# Patient Record
Sex: Male | Born: 1995 | Race: White | Hispanic: No | Marital: Single | Smoking: Former smoker
Health system: Southern US, Community
[De-identification: ages and names within clinical notes are randomized; demographics above are authoritative.]

## PROBLEM LIST (undated history)

## (undated) ENCOUNTER — Ambulatory Visit: Admission: EM | Payer: No Typology Code available for payment source

## (undated) DIAGNOSIS — F909 Attention-deficit hyperactivity disorder, unspecified type: Secondary | ICD-10-CM

## (undated) DIAGNOSIS — G4700 Insomnia, unspecified: Secondary | ICD-10-CM

## (undated) HISTORY — DX: Attention-deficit hyperactivity disorder, unspecified type: F90.9

## (undated) HISTORY — PX: WISDOM TOOTH EXTRACTION: SHX21

## (undated) HISTORY — DX: Insomnia, unspecified: G47.00

---
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
## 2014-09-14 HISTORY — PX: FOOT SURGERY: SHX648

## 2017-10-08 ENCOUNTER — Ambulatory Visit (INDEPENDENT_AMBULATORY_CARE_PROVIDER_SITE_OTHER): Payer: PRIVATE HEALTH INSURANCE | Admitting: Family Medicine

## 2017-10-08 ENCOUNTER — Encounter: Payer: Self-pay | Admitting: Family Medicine

## 2017-10-08 VITALS — BP 135/72 | HR 86 | Temp 98.5°F | Resp 16 | Ht 67.0 in | Wt 162.4 lb

## 2017-10-08 DIAGNOSIS — Z7689 Persons encountering health services in other specified circumstances: Secondary | ICD-10-CM

## 2017-10-08 DIAGNOSIS — F909 Attention-deficit hyperactivity disorder, unspecified type: Secondary | ICD-10-CM

## 2017-10-08 DIAGNOSIS — G4700 Insomnia, unspecified: Secondary | ICD-10-CM | POA: Diagnosis not present

## 2017-10-08 DIAGNOSIS — Z23 Encounter for immunization: Secondary | ICD-10-CM | POA: Diagnosis not present

## 2017-10-08 MED ORDER — VARICELLA VIRUS VACCINE LIVE 1350 PFU/0.5ML IJ SUSR
0.5000 mL | Freq: Once | INTRAMUSCULAR | Status: AC
  Administered 2017-10-08: 0.5 mL via SUBCUTANEOUS

## 2017-10-08 MED ORDER — TRAZODONE HCL 50 MG PO TABS: 25.0000 mg | ORAL_TABLET | Freq: Every evening | ORAL | 1 refills | Status: DC | PRN

## 2017-10-08 NOTE — Progress Notes (Signed)
BP 135/72 (BP Location: Left Arm, Patient Position: Sitting)   Pulse 86   Temp 98.5 F (36.9 C) (Oral)   Resp 16   Ht 5\' 7"  (1.702 m)   Wt 162 lb 6.4 oz (73.7 kg)   BMI 25.44 kg/m    Subjective:    Patient ID: Dillon Sosa, male    DOB: 01/15/1996, 22 y.o.   MRN: [id number]  HPI: Dillon Sosa is a 22 y.o. male  Chief Complaint  Patient presents with  . Establish Care   Pt here today to establish care. Has requirements for CNA course including 2 step TB test, Td, and varicella #2.   Only known medical issues is ADHD, for which he hasn't been on medications for several years. Not currently having much trouble with sxs.   Main concern today is 2 year hx of significant insomnia, either has trouble falling asleep or gets falling sensations in the night which wake him up and he can't go back to sleep. Significant family hx of insomnia issues. 4 nights per week having issues with sleep. Nyquil, melatonin, and benadryl haven't helped at all. Does not eat sweets, exercises regularly, does not drink caffiene.   Past Medical History:  Diagnosis Date  . ADHD    Social History   Socioeconomic History  . Marital status: Single    Spouse name: Not on file  . Number of children: Not on file  . Years of education: Not on file  . Highest education level: Not on file  Social Needs  . Financial resource strain: Not on file  . Food insecurity - worry: Not on file  . Food insecurity - inability: Not on file  . Transportation needs - medical: Not on file  . Transportation needs - non-medical: Not on file  Occupational History  . Not on file  Tobacco Use  . Smoking status: Former Smoker    Types: Cigarettes  . Smokeless tobacco: Current User    Types: Chew  Substance and Sexual Activity  . Alcohol use: Yes    Comment: socially  . Drug use: No  . Sexual activity: Not on file  Other Topics Concern  . Not on file  Social History Narrative  . Not on file   Relevant  past medical, surgical, family and social history reviewed and updated as indicated. Interim medical history since our last visit reviewed. Allergies and medications reviewed and updated.  Review of Systems  Constitutional: Positive for fatigue (from chronic insomnia).  HENT: Negative.   Respiratory: Negative.   Cardiovascular: Negative.   Gastrointestinal: Negative.   Musculoskeletal: Negative.   Skin: Negative.   Neurological: Negative.   Psychiatric/Behavioral: Positive for sleep disturbance.   Per HPI unless specifically indicated above     Objective:    BP 135/72 (BP Location: Left Arm, Patient Position: Sitting)   Pulse 86   Temp 98.5 F (36.9 C) (Oral)   Resp 16   Ht 5\' 7"  (1.702 m)   Wt 162 lb 6.4 oz (73.7 kg)   BMI 25.44 kg/m   Wt Readings from Last 3 Encounters:  10/08/17 162 lb 6.4 oz (73.7 kg)    Physical Exam  Constitutional: He is oriented to person, place, and time. He appears well-developed and well-nourished. No distress.  HENT:  Head: Atraumatic.  Eyes: Conjunctivae are normal. Pupils are equal, round, and reactive to light.  Neck: Normal range of motion. Neck supple.  Cardiovascular: Normal rate and normal heart sounds.  Pulmonary/Chest:  Effort normal and breath sounds normal. No respiratory distress.  Musculoskeletal: Normal range of motion.  Neurological: He is alert and oriented to person, place, and time.  Skin: Skin is warm and dry.  Psychiatric: He has a normal mood and affect. His behavior is normal.  Nursing note and vitals reviewed.  No results found for this or any previous visit.    Assessment & Plan:   Problem List Items Addressed This Visit      Other   ADHD    Stable, controlled with good lifestyle modifications.       Insomnia    Long discussion about options, pt agreeable to trying trazodone 25-50 mg nightly prn and monitoring for benefit. Continue good sleep hygiene and lifestyle habits. F/u in about 6 weeks for recheck        Other Visit Diagnoses    Need for varicella vaccine    -  Primary   Relevant Medications   varicella virus vaccine live (VARIVAX) injection 0.5 mL (Completed)   Encounter to establish care       Discussed he would need to go to walk in clinic for PPD testing, and Td from last year found in Care Everywhere so not due for new booster       Follow up plan: Return in about 6 weeks (around 11/19/2017) for Sleep f/u.

## 2017-10-10 ENCOUNTER — Encounter: Payer: Self-pay | Admitting: Family Medicine

## 2017-10-10 DIAGNOSIS — G4700 Insomnia, unspecified: Secondary | ICD-10-CM | POA: Insufficient documentation

## 2017-10-10 DIAGNOSIS — F909 Attention-deficit hyperactivity disorder, unspecified type: Secondary | ICD-10-CM | POA: Insufficient documentation

## 2017-10-10 NOTE — Patient Instructions (Signed)
Follow-up in 6 weeks

## 2017-10-10 NOTE — Assessment & Plan Note (Signed)
Long discussion about options, pt agreeable to trying trazodone 25-50 mg nightly prn and monitoring for benefit. Continue good sleep hygiene and lifestyle habits. F/u in about 6 weeks for recheck

## 2017-10-10 NOTE — Assessment & Plan Note (Signed)
Stable, controlled with good lifestyle modifications.

## 2017-11-19 ENCOUNTER — Encounter: Payer: Self-pay | Admitting: Family Medicine

## 2017-11-19 ENCOUNTER — Ambulatory Visit (INDEPENDENT_AMBULATORY_CARE_PROVIDER_SITE_OTHER): Payer: PRIVATE HEALTH INSURANCE | Admitting: Family Medicine

## 2017-11-19 VITALS — BP 120/63 | HR 68 | Temp 98.7°F | Wt 167.8 lb

## 2017-11-19 DIAGNOSIS — G4700 Insomnia, unspecified: Secondary | ICD-10-CM | POA: Diagnosis not present

## 2017-11-19 DIAGNOSIS — F909 Attention-deficit hyperactivity disorder, unspecified type: Secondary | ICD-10-CM

## 2017-11-19 MED ORDER — TRAZODONE HCL 100 MG PO TABS: 100.0000 mg | ORAL_TABLET | Freq: Every day | ORAL | 3 refills | Status: DC

## 2017-11-19 MED ORDER — ATOMOXETINE HCL 40 MG PO CAPS: 40.0000 mg | ORAL_CAPSULE | Freq: Every day | ORAL | 1 refills | Status: DC

## 2017-11-19 NOTE — Progress Notes (Signed)
BP 120/63 (BP Location: Left Arm, Patient Position: Sitting, Cuff Size: Normal)   Pulse 68   Temp 98.7 F (37.1 C) (Oral)   Wt 167 lb 12.8 oz (76.1 kg)   SpO2 99%   BMI 26.28 kg/m    Subjective:    Patient ID: Dillon Sosa, male    DOB: 06-Oct-1995, 22 y.o.   MRN: [id number]  HPI: Dillon Sosa is a 22 y.o. male  Chief Complaint  Patient presents with  . Sleep Follow-up    Patient states he has to take 1.5-2 tablets of trazadone to get it to work.   Pt here today for sleep f/u after starting on trazodone. Has been taking 1.5-2 tabs daily in order to achieve a good night's sleep. States this dose is going very well for him, does not feel groggy when he wakes or cause any other side effects he's noted.   Took ADHD medications in high school, feels he may need to return to them as his professors and employer are noting some glaring focus issues with him. Does not recall which medicine he was on previously but has always tolerated them well with no side effects.   Past Medical History:  Diagnosis Date  . ADHD    Social History   Socioeconomic History  . Marital status: Single    Spouse name: Not on file  . Number of children: Not on file  . Years of education: Not on file  . Highest education level: Not on file  Social Needs  . Financial resource strain: Not on file  . Food insecurity - worry: Not on file  . Food insecurity - inability: Not on file  . Transportation needs - medical: Not on file  . Transportation needs - non-medical: Not on file  Occupational History  . Not on file  Tobacco Use  . Smoking status: Former Smoker    Types: Cigarettes  . Smokeless tobacco: Former Neurosurgeon    Types: Chew    Quit date: 10/15/2017  Substance and Sexual Activity  . Alcohol use: Yes    Comment: socially/weekends  . Drug use: No  . Sexual activity: Not on file  Other Topics Concern  . Not on file  Social History Narrative  . Not on file   Relevant past medical,  surgical, family and social history reviewed and updated as indicated. Interim medical history since our last visit reviewed. Allergies and medications reviewed and updated.  Review of Systems  Per HPI unless specifically indicated above     Objective:    BP 120/63 (BP Location: Left Arm, Patient Position: Sitting, Cuff Size: Normal)   Pulse 68   Temp 98.7 F (37.1 C) (Oral)   Wt 167 lb 12.8 oz (76.1 kg)   SpO2 99%   BMI 26.28 kg/m   Wt Readings from Last 3 Encounters:  11/19/17 167 lb 12.8 oz (76.1 kg)  10/08/17 162 lb 6.4 oz (73.7 kg)    Physical Exam  Constitutional: He is oriented to person, place, and time. He appears well-developed and well-nourished. No distress.  HENT:  Head: Atraumatic.  Eyes: Conjunctivae are normal. Pupils are equal, round, and reactive to light.  Neck: Normal range of motion. Neck supple.  Cardiovascular: Normal rate and normal heart sounds.  Pulmonary/Chest: Effort normal and breath sounds normal. No respiratory distress.  Musculoskeletal: Normal range of motion.  Neurological: He is alert and oriented to person, place, and time.  Skin: Skin is warm and dry.  Psychiatric: He has a normal mood and affect. His behavior is normal.  Nursing note and vitals reviewed.   No results found for this or any previous visit.    Assessment & Plan:   Problem List Items Addressed This Visit      Other   ADHD    Discussed trying non-stimulant options to help avoid worsening his insomnia sxs. Will start Strattera and monitor closely for benefit. Risks and benefits reviewed with pt      Insomnia - Primary    Good benefit was trazodone at higher doses, will increase strength to 100 mg tab prn for sleep.           Follow up plan: Return in about 3 months (around 02/19/2018) for CPE.

## 2017-11-22 NOTE — Patient Instructions (Signed)
Follow up for CPE 

## 2017-11-22 NOTE — Assessment & Plan Note (Signed)
Discussed trying non-stimulant options to help avoid worsening his insomnia sxs. Will start Strattera and monitor closely for benefit. Risks and benefits reviewed with pt

## 2017-11-22 NOTE — Assessment & Plan Note (Signed)
Good benefit was trazodone at higher doses, will increase strength to 100 mg tab prn for sleep.

## 2017-12-01 ENCOUNTER — Other Ambulatory Visit: Payer: Self-pay | Admitting: Family Medicine

## 2017-12-10 ENCOUNTER — Encounter: Payer: Self-pay | Admitting: Family Medicine

## 2017-12-10 ENCOUNTER — Ambulatory Visit: Payer: PRIVATE HEALTH INSURANCE | Admitting: Family Medicine

## 2017-12-10 VITALS — BP 118/71 | HR 97 | Temp 98.3°F | Wt 161.4 lb

## 2017-12-10 DIAGNOSIS — N489 Disorder of penis, unspecified: Secondary | ICD-10-CM

## 2017-12-10 DIAGNOSIS — F909 Attention-deficit hyperactivity disorder, unspecified type: Secondary | ICD-10-CM | POA: Diagnosis not present

## 2017-12-10 DIAGNOSIS — G4700 Insomnia, unspecified: Secondary | ICD-10-CM | POA: Diagnosis not present

## 2017-12-10 MED ORDER — ATOMOXETINE HCL 80 MG PO CAPS: 80.0000 mg | ORAL_CAPSULE | Freq: Every day | ORAL | 3 refills | Status: DC

## 2017-12-10 NOTE — Progress Notes (Signed)
BP 118/71 (BP Location: Right Arm, Patient Position: Sitting, Cuff Size: Large)   Pulse 97   Temp 98.3 F (36.8 C) (Oral)   Wt 161 lb 6.4 oz (73.2 kg)   SpO2 97%   BMI 25.28 kg/m    Subjective:    Patient ID: Dillon Sosa, male    DOB: February 27, 1996, 22 y.o.   MRN: [id number]  HPI: Dillon Sosa is a 22 y.o. male  Chief Complaint  Patient presents with  . Sores    On penis. x's 2 weeks. Sometimes painful.    Pt here today with a new painful rash on penis x 2+ weeks. No penile discharge, dysuria, fevers, weight loss, adenopathy. Has not been trying anything OTC for relief. No known hx of STIs. Has had same partner x 2 months.   Also wanting to discuss changing strattera dose as he feels some benefit but not as much as he would like. Was previously on stimulant medications for his focus issues. Denies side effects from the medication.   Still sleeping very well on current trazodone dose. Very happy with it, does not make him groggy the next morning.   Past Medical History:  Diagnosis Date  . ADHD    Social History   Socioeconomic History  . Marital status: Single    Spouse name: Not on file  . Number of children: Not on file  . Years of education: Not on file  . Highest education level: Not on file  Occupational History  . Not on file  Social Needs  . Financial resource strain: Not on file  . Food insecurity:    Worry: Not on file    Inability: Not on file  . Transportation needs:    Medical: Not on file    Non-medical: Not on file  Tobacco Use  . Smoking status: Former Smoker    Types: Cigarettes  . Smokeless tobacco: Former NeurosurgeonUser    Types: Chew    Quit date: 10/15/2017  Substance and Sexual Activity  . Alcohol use: Yes    Comment: socially/weekends  . Drug use: No  . Sexual activity: Not on file  Lifestyle  . Physical activity:    Days per week: Not on file    Minutes per session: Not on file  . Stress: Not on file  Relationships  . Social  connections:    Talks on phone: Not on file    Gets together: Not on file    Attends religious service: Not on file    Active member of club or organization: Not on file    Attends meetings of clubs or organizations: Not on file    Relationship status: Not on file  . Intimate partner violence:    Fear of current or ex partner: Not on file    Emotionally abused: Not on file    Physically abused: Not on file    Forced sexual activity: Not on file  Other Topics Concern  . Not on file  Social History Narrative  . Not on file    Relevant past medical, surgical, family and social history reviewed and updated as indicated. Interim medical history since our last visit reviewed. Allergies and medications reviewed and updated.  Review of Systems  Per HPI unless specifically indicated above     Objective:    BP 118/71 (BP Location: Right Arm, Patient Position: Sitting, Cuff Size: Large)   Pulse 97   Temp 98.3 F (36.8 C) (Oral)  Wt 161 lb 6.4 oz (73.2 kg)   SpO2 97%   BMI 25.28 kg/m   Wt Readings from Last 3 Encounters:  12/10/17 161 lb 6.4 oz (73.2 kg)  11/19/17 167 lb 12.8 oz (76.1 kg)  10/08/17 162 lb 6.4 oz (73.7 kg)    Physical Exam  Constitutional: He is oriented to person, place, and time. He appears well-developed and well-nourished. No distress.  HENT:  Head: Atraumatic.  Eyes: Pupils are equal, round, and reactive to light. Conjunctivae are normal.  Neck: Normal range of motion. Neck supple.  Cardiovascular: Normal rate and normal heart sounds.  Pulmonary/Chest: Effort normal and breath sounds normal. No respiratory distress.  Genitourinary:  Genitourinary Comments: Multiple erythematous ulcers across penis, one small ulcer on scrotum. Mildly ttp. No drainage.   Musculoskeletal: Normal range of motion.  Neurological: He is alert and oriented to person, place, and time.  Skin: Skin is warm and dry.  Psychiatric: He has a normal mood and affect. His behavior is  normal.  Nursing note and vitals reviewed.   Results for orders placed or performed in visit on 12/10/17  GC/Chlamydia Probe Amp  Result Value Ref Range   Chlamydia trachomatis, NAA Negative Negative   Neisseria gonorrhoeae by PCR Negative Negative  HSV(herpes simplex vrs) 1+2 ab-IgG  Result Value Ref Range   HSV 1 Glycoprotein G Ab, IgG <0.91 0.00 - 0.90 index   HSV 2 IgG, Type Spec <0.91 0.00 - 0.90 index  RPR  Result Value Ref Range   RPR Ser Ql Non Reactive Non Reactive  HIV antibody  Result Value Ref Range   HIV Screen 4th Generation wRfx Non Reactive Non Reactive      Assessment & Plan:   Problem List Items Addressed This Visit      Other   ADHD    Will increase strattera dose and monitor for benefit. Hoping to keep him off of stimulant medications as he's now sleeping well and feeling less agitated.       Insomnia    Stable and well controlled, continue current regimen       Other Visit Diagnoses    Penile lesion    -  Primary   Will do a full STI panel. Discussed vaseline and avoidance of any sexual contact until resolved. Await results   Relevant Orders   HSV(herpes simplex vrs) 1+2 ab-IgG (Completed)   RPR (Completed)   HIV antibody (Completed)   GC/Chlamydia Probe Amp (Completed)       Follow up plan: Return for as scheduled.

## 2017-12-11 LAB — HSV(HERPES SIMPLEX VRS) I + II AB-IGG
HSV 1 Glycoprotein G Ab, IgG: <0.91 index (ref 0.00–0.90)
HSV 2 IgG, Type Spec: <0.91 index (ref 0.00–0.90)

## 2017-12-11 LAB — RPR: RPR: NONREACTIVE

## 2017-12-11 LAB — HIV ANTIBODY (ROUTINE TESTING W REFLEX): HIV Screen 4th Generation wRfx: NONREACTIVE

## 2017-12-12 LAB — GC/CHLAMYDIA PROBE AMP
CHLAMYDIA, DNA PROBE: NEGATIVE
Neisseria gonorrhoeae by PCR: NEGATIVE

## 2017-12-13 ENCOUNTER — Other Ambulatory Visit: Payer: Self-pay | Admitting: Family Medicine

## 2017-12-13 DIAGNOSIS — N489 Disorder of penis, unspecified: Secondary | ICD-10-CM

## 2017-12-13 NOTE — Assessment & Plan Note (Signed)
Stable and well controlled, continue current regimen 

## 2017-12-13 NOTE — Assessment & Plan Note (Signed)
Will increase strattera dose and monitor for benefit. Hoping to keep him off of stimulant medications as he's now sleeping well and feeling less agitated.

## 2017-12-13 NOTE — Patient Instructions (Signed)
Follow up as scheduled.  

## 2017-12-14 ENCOUNTER — Telehealth: Payer: Self-pay

## 2017-12-14 NOTE — Telephone Encounter (Signed)
See other telephone encounter open

## 2017-12-14 NOTE — Telephone Encounter (Signed)
Copied from CRM [id number]. Topic: Quick Communication - Office Called Patient >> Dec 13, 2017  4:53 PM Stephannie LiSimmons, Janett L, NT wrote: Reason for CRM: Patient says he missed a call from the office please cal him at [phone]

## 2017-12-20 ENCOUNTER — Telehealth: Payer: Self-pay | Admitting: Family Medicine

## 2017-12-20 NOTE — Telephone Encounter (Signed)
Copied from CRM [id number]. Topic: Quick Communication - See Telephone Encounter >> Dec 20, 2017  5:16 PM Raquel SarnaHayes, Teresa G wrote: Since STD came back neg, Pt sores are still there and is getting worse.  Pt doesn't know what else to do. Please call pt back at the times of 9 am - 12 pm  - 2 pm.  These asre the only times he can talk during the day due to work.  Pt is also having issue with his MyChart due to his SS number being entered wrong.  He has already spoked with Nutritional therapistMyChart advisors.  They explained this to him.

## 2017-12-21 ENCOUNTER — Telehealth: Payer: Self-pay | Admitting: Family Medicine

## 2017-12-21 MED ORDER — VALACYCLOVIR HCL 1 G PO TABS: 1000.0000 mg | ORAL_TABLET | Freq: Two times a day (BID) | ORAL | 0 refills | Status: DC

## 2017-12-21 MED ORDER — PREDNISONE 20 MG PO TABS: 40.0000 mg | ORAL_TABLET | Freq: Every day | ORAL | 0 refills | Status: DC

## 2017-12-21 NOTE — Telephone Encounter (Signed)
Pt returned call, please call back [phone]

## 2017-12-21 NOTE — Telephone Encounter (Signed)
Let me know if he wants those medications and I'll get them sent

## 2017-12-21 NOTE — Telephone Encounter (Signed)
We can certainly start some antivirals and prednisone to see if that helps any, but what is the status on his Urology referral?

## 2017-12-21 NOTE — Telephone Encounter (Signed)
[facility]. Please send back to me so I can call patient with information provided.

## 2017-12-21 NOTE — Telephone Encounter (Signed)
I have tried calling multiple times, using different phone and call keeps dropping. Verified it is not our phones that keep dropping the calls.

## 2017-12-21 NOTE — Telephone Encounter (Signed)
I tried calling pharmacy 4 times. The call keeps dropping.

## 2017-12-21 NOTE — Telephone Encounter (Signed)
Spoke with patient.  Signal was breaking up. [facility] and medications.  Patient agreed to medication.

## 2017-12-21 NOTE — Telephone Encounter (Signed)
Copied from CRM [id number]. Topic: Quick Communication - See Telephone Encounter >> Dec 21, 2017  9:55 AM Diana EvesHoyt, Maryann B wrote: CRM for notification. See Telephone encounter for: 12/21/17. Verlon AuLeslie with [facility] calling for quantity clarifacation on the valACYclovir (VALTREX) 1000 MG tablet. Does he need a quantity of 20 for 2x 10 days or a quantity of 10 for 2x 5 days. CB# [phone].

## 2017-12-21 NOTE — Telephone Encounter (Signed)
Fleet Contrasachel, I will call patient. But any idea what I could advise patient?

## 2017-12-21 NOTE — Telephone Encounter (Signed)
Closed in error, pt needs to be called back

## 2017-12-21 NOTE — Addendum Note (Signed)
Addended by: Roosvelt MaserLANE, Alanee Ting E on: 12/21/2017 09:58 AM   Modules accepted: Orders

## 2017-12-21 NOTE — Telephone Encounter (Signed)
Will send in prednisone and valtrex to see if this helps with unspecified penile lesions until his Urology appt. Pt was negative for all STIs when checked a week or two ago and has not tried any new products topically.

## 2017-12-21 NOTE — Telephone Encounter (Signed)
As written, BID x 10 days to get him through until his apptkeri

## 2017-12-21 NOTE — Telephone Encounter (Signed)
Tried calling patient, no answer. [facility]. Medication should be at pharmacy this afternoon and [facility] Appointment 12/28/2017 at 3:45PM.  Fleet Contrasachel, please send in medication for patient.

## 2017-12-24 NOTE — Telephone Encounter (Signed)
Patient came and picked up medication on 12/21/17

## 2017-12-28 ENCOUNTER — Ambulatory Visit: Payer: PRIVATE HEALTH INSURANCE | Admitting: Urology

## 2018-02-10 ENCOUNTER — Encounter: Payer: Self-pay | Admitting: Family Medicine

## 2018-02-17 ENCOUNTER — Ambulatory Visit (INDEPENDENT_AMBULATORY_CARE_PROVIDER_SITE_OTHER): Payer: PRIVATE HEALTH INSURANCE | Admitting: Urology

## 2018-02-17 ENCOUNTER — Encounter: Payer: Self-pay | Admitting: Urology

## 2018-02-17 VITALS — BP 119/68 | HR 48 | Ht 66.0 in | Wt 161.2 lb

## 2018-02-17 DIAGNOSIS — R21 Rash and other nonspecific skin eruption: Secondary | ICD-10-CM | POA: Diagnosis not present

## 2018-02-17 MED ORDER — TRIAMCINOLONE ACETONIDE 0.1 % EX CREA: 1.0000 "application " | TOPICAL_CREAM | Freq: Two times a day (BID) | CUTANEOUS | 0 refills | Status: AC

## 2018-02-17 NOTE — Progress Notes (Signed)
02/17/2018 3:05 PM   Dillon Sosa 16-Nov-1995 [id number]  Referring provider: Particia Nearing, PA-C [address] [place], [place] [address]  Chief Complaint  Patient presents with  . Skin Problem    lesions on penis    HPI: Dillon Sosa is a 22 year old male seen in consultation at the request of Roosvelt Maser for evaluation of a penile rash.  He was seen by his PCP on 12/10/2017 with a 2-week history of an erythematous penile rash.  He complains of mild pruritus and some irritation with intercourse.  He had a negative STD work-up.  His exam was described as an erythematous rash with ulceration.  He was treated with prednisone and Valtrex and did not note improvement.  He has tried over-the-counter antifungals and Neosporin.  He has no bothersome lower urinary tract symptoms.   PMH: Past Medical History:  Diagnosis Date  . ADHD   . Insomnia     Surgical History: Past Surgical History:  Procedure Laterality Date  . FOOT SURGERY Left 2016    Home Medications:  Allergies as of 02/17/2018   No Known Allergies     Medication List        Accurate as of 02/17/18  3:05 PM. Always use your most recent med list.          atomoxetine 80 MG capsule Commonly known as:  STRATTERA Take 1 capsule (80 mg total) by mouth daily.   traZODone 100 MG tablet Commonly known as:  DESYREL Take 1 tablet (100 mg total) by mouth at bedtime.       Allergies: No Known Allergies  Family History: Family History  Problem Relation Age of Onset  . Breast cancer Mother   . Insomnia Father   . Heart attack Maternal Grandmother     Social History:  reports that he has quit smoking. His smoking use included cigarettes. He quit after 2.00 years of use. He quit smokeless tobacco use about 4 months ago. His smokeless tobacco use included chew. He reports that he drinks alcohol. He reports that he does not use drugs.  ROS: UROLOGY Frequent Urination?: No Hard to postpone  urination?: No Burning/pain with urination?: No Get up at night to urinate?: No Leakage of urine?: No Urine stream starts and stops?: No Trouble starting stream?: No Do you have to strain to urinate?: No Blood in urine?: No Urinary tract infection?: No Sexually transmitted disease?: No Injury to kidneys or bladder?: No Painful intercourse?: No Weak stream?: No Erection problems?: No Penile pain?: Yes  Gastrointestinal Nausea?: No Vomiting?: No Indigestion/heartburn?: No Diarrhea?: No Constipation?: No  Constitutional Fever: No Night sweats?: No Weight loss?: No Fatigue?: No  Skin Skin rash/lesions?: No Itching?: No  Eyes Blurred vision?: No Double vision?: No  Ears/Nose/Throat Sore throat?: No Sinus problems?: No  Hematologic/Lymphatic Swollen glands?: No Easy bruising?: No  Cardiovascular Leg swelling?: No Chest pain?: No  Respiratory Cough?: No Shortness of breath?: No  Endocrine Excessive thirst?: No  Musculoskeletal Back pain?: No Joint pain?: No  Neurological Headaches?: No Dizziness?: No  Psychologic Depression?: No Anxiety?: No  Physical Exam: BP 119/68 (BP Location: Left Arm, Patient Position: Sitting, Cuff Size: Normal)   Pulse (!) 48   Ht 5\' 6"  (1.676 m)   Wt 161 lb 3.2 oz (73.1 kg)   BMI 26.02 kg/m   Constitutional:  Alert and oriented, No acute distress. HEENT: [place] AT, moist mucus membranes.  Trachea midline, no masses. Cardiovascular: No clubbing, cyanosis, or edema.  Respiratory: Normal respiratory effort, no increased work of breathing. GI: Abdomen is soft, nontender, nondistended, no abdominal masses GU: No CVA tenderness.  Penis is circumcised.  There is a diffuse erythematous macular penile rash.  On the distal penile shaft there is cracking and peeling of the superficial skin. Lymph: No cervical or inguinal lymphadenopathy. Skin: No rashes, bruises or suspicious lesions. Neurologic: Grossly intact, no focal deficits,  moving all 4 extremities. Psychiatric: Normal mood and affect.   Assessment & Plan:   22 year old male with an erythematous macular penile rash. Will treat with a topical steroid and if no improvement would recommend a dermatology evaluation.   Riki AltesScott C Kamorah Nevils, MD  [facility] [address], [address] [place], [place] [address] [phone]

## 2018-02-18 ENCOUNTER — Encounter: Payer: PRIVATE HEALTH INSURANCE | Admitting: Family Medicine

## 2018-03-07 ENCOUNTER — Other Ambulatory Visit: Payer: Self-pay | Admitting: Family Medicine

## 2018-03-09 ENCOUNTER — Encounter: Payer: Self-pay | Admitting: Family Medicine

## 2018-03-09 NOTE — Telephone Encounter (Signed)
trazodone refill Last Refill:11/19/17 # 30 tab 3 RF Last OV: 12/10/17 PCP: Roosvelt Maserachel Lane PA [facility] [address]

## 2018-03-10 MED ORDER — TRAZODONE HCL 100 MG PO TABS: 100.0000 mg | ORAL_TABLET | Freq: Every day | ORAL | 3 refills | Status: DC

## 2018-03-28 ENCOUNTER — Other Ambulatory Visit: Payer: Self-pay | Admitting: Family Medicine

## 2018-03-28 NOTE — Telephone Encounter (Signed)
Strattera refill Last OV:12/10/17 Last refill:12/10/17 30 cap/3 refill ONG:EXBMPCP:Lane [phone] [place] [place] - [address] [phone] (Phone) [phone] (Fax)

## 2018-04-29 ENCOUNTER — Encounter: Payer: Self-pay | Admitting: Family Medicine

## 2018-04-29 ENCOUNTER — Ambulatory Visit (INDEPENDENT_AMBULATORY_CARE_PROVIDER_SITE_OTHER): Payer: PRIVATE HEALTH INSURANCE | Admitting: Family Medicine

## 2018-04-29 ENCOUNTER — Other Ambulatory Visit: Payer: Self-pay

## 2018-04-29 VITALS — BP 105/59 | HR 51 | Temp 98.6°F | Ht 66.0 in | Wt 166.2 lb

## 2018-04-29 DIAGNOSIS — Z0000 Encounter for general adult medical examination without abnormal findings: Secondary | ICD-10-CM | POA: Diagnosis not present

## 2018-04-29 DIAGNOSIS — Z79899 Other long term (current) drug therapy: Secondary | ICD-10-CM

## 2018-04-29 DIAGNOSIS — F909 Attention-deficit hyperactivity disorder, unspecified type: Secondary | ICD-10-CM

## 2018-04-29 DIAGNOSIS — G4700 Insomnia, unspecified: Secondary | ICD-10-CM | POA: Diagnosis not present

## 2018-04-29 LAB — UA/M W/RFLX CULTURE, ROUTINE
Bilirubin, UA: NEGATIVE
Glucose, UA: NEGATIVE
KETONES UA: NEGATIVE
Leukocytes, UA: NEGATIVE
NITRITE UA: NEGATIVE
PH UA: 6 (ref 5.0–7.5)
Protein, UA: NEGATIVE
RBC UA: NEGATIVE
SPEC GRAV UA: 1.01 (ref 1.005–1.030)
UUROB: 0.2 mg/dL (ref 0.2–1.0)

## 2018-04-29 MED ORDER — TRAZODONE HCL 150 MG PO TABS: 150.0000 mg | ORAL_TABLET | Freq: Every day | ORAL | 3 refills | Status: DC

## 2018-04-29 MED ORDER — AMPHETAMINE-DEXTROAMPHETAMINE 15 MG PO TABS: 15.0000 mg | ORAL_TABLET | Freq: Every day | ORAL | 0 refills | Status: DC

## 2018-04-29 NOTE — Progress Notes (Signed)
BP (!) 105/59   Pulse (!) 51   Temp 98.6 F (37 C) (Oral)   Ht 5\' 6"  (1.676 m)   Wt 166 lb 3.2 oz (75.4 kg)   SpO2 96%   BMI 26.83 kg/m    Subjective:    Patient ID: Dillon Sosa, male    DOB: [birth year], 22 y.o.   MRN: [id number]  HPI: Dillon FredricksonJustin E Zayas is a 22 y.o. male presenting on 04/29/2018 for comprehensive medical examination. Current medical complaints include:see below  Having great benefit with trazodone for his insomnia, but still notes 1-2 nights per week where he struggles to sleep well. Tries to avoid caffeine after breakfast and have a steady sleep routine.   Has been on adderall for most of his young life for combined inattentive/hyperactive ADHD but has been off for several years now. Trying strattera the past 6 months at increasing doses with no relief. 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 Feels like he needs to be back on.   Depression Screen done today and results listed below:  [facility]/2018  Decreased Interest 0  Down, Depressed, Hopeless 0  PHQ - 2 Score 0  Altered sleeping 3  Tired, decreased energy 0  Change in appetite 0  Feeling bad or failure about yourself  0  Trouble concentrating 1  Moving slowly or fidgety/restless 0  Suicidal thoughts 0  PHQ-9 Score 4    The patient does not have a history of falls. I did not complete a risk assessment for falls. A plan of care for falls was not documented.   Past Medical History:  Past Medical History:  Diagnosis Date  . ADHD   . Insomnia     Surgical History:  Past Surgical History:  Procedure Laterality Date  . FOOT SURGERY Left 2016    Medications:  No current outpatient medications on file prior to visit.   No current facility-administered medications on file prior to visit.     Allergies:  No Known Allergies  Social History:  Social History   Socioeconomic  History  . Marital status: Single    Spouse name: Not on file  . Number of children: Not on file  . Years of education: Not on file  . Highest education level: Not on file  Occupational History  . Not on file  Social Needs  . Financial resource strain: Not on file  . Food insecurity:    Worry: Not on file    Inability: Not on file  . Transportation needs:    Medical: Not on file    Non-medical: Not on file  Tobacco Use  . Smoking status: Former Smoker    Years: 2.00    Types: Cigarettes  . Smokeless tobacco: Former NeurosurgeonUser    Types: Chew    Quit date: 10/15/2017  Substance and Sexual Activity  . Alcohol use: Yes    Comment: 6-24 beers per week   . Drug use: No  . Sexual activity: Yes  Lifestyle  . Physical activity:    Days per week: Not on file    Minutes per session: Not on file  . Stress: Not on file  Relationships  . Social connections:    Talks on phone: Not on file    Gets together: Not on file    Attends religious service: Not on file    Active member of club or  organization: Not on file    Attends meetings of clubs or organizations: Not on file    Relationship status: Not on file  . Intimate partner violence:    Fear of current or ex partner: Not on file    Emotionally abused: Not on file    Physically abused: Not on file    Forced sexual activity: Not on file  Other Topics Concern  . Not on file  Social History Narrative  . Not on file   Social History   Tobacco Use  Smoking Status Former Smoker  . Years: 2.00  . Types: Cigarettes  Smokeless Tobacco Former NeurosurgeonUser  . Types: Chew  . Quit date: 10/15/2017   Social History   Substance and Sexual Activity  Alcohol Use Yes   Comment: 6-24 beers per week     Family History:  Family History  Problem Relation Age of Onset  . Breast cancer Mother   . Insomnia Father   . Heart attack Maternal Grandmother     Past medical history, surgical history, medications, allergies, family history and social  history reviewed with patient today and changes made to appropriate areas of the chart.   Review of Systems - General ROS: positive for  - sleep disturbance Psychological ROS: positive for - concentration difficulties Ophthalmic ROS: negative ENT ROS: negative Respiratory ROS: no cough, shortness of breath, or wheezing Cardiovascular ROS: no chest pain or dyspnea on exertion Gastrointestinal ROS: no abdominal pain, change in bowel habits, or black or bloody stools Genito-Urinary ROS: no dysuria, trouble voiding, or hematuria Musculoskeletal ROS: negative Neurological ROS: no TIA or stroke symptoms Dermatological ROS: negative All other ROS negative except what is listed above and in the HPI.      Objective:    BP (!) 105/59   Pulse (!) 51   Temp 98.6 F (37 C) (Oral)   Ht 5\' 6"  (1.676 m)   Wt 166 lb 3.2 oz (75.4 kg)   SpO2 96%   BMI 26.83 kg/m   Wt Readings from Last 3 Encounters:  04/29/18 166 lb 3.2 oz (75.4 kg)  02/17/18 161 lb 3.2 oz (73.1 kg)  12/10/17 161 lb 6.4 oz (73.2 kg)    Physical Exam  Constitutional: He is oriented to person, place, and time. He appears well-developed and well-nourished. No distress.  HENT:  Head: Atraumatic.  Right Ear: External ear normal.  Left Ear: External ear normal.  Nose: Nose normal.  Mouth/Throat: Oropharynx is clear and moist.  Eyes: Pupils are equal, round, and reactive to light. Conjunctivae are normal. No scleral icterus.  Neck: Normal range of motion. Neck supple.  Cardiovascular: Normal rate, regular rhythm, normal heart sounds and intact distal pulses.  No murmur heard. Pulmonary/Chest: Effort normal and breath sounds normal. No respiratory distress.  Abdominal: Soft. Bowel sounds are normal. He exhibits no distension and no mass. There is no tenderness. There is no guarding.  Musculoskeletal: Normal range of motion. He exhibits no edema or tenderness.  Neurological: He is alert and oriented to person, place, and time. He  has normal reflexes.  Skin: Skin is warm and dry. No rash noted.  Psychiatric: He has a normal mood and affect. His behavior is normal.  Nursing note and vitals reviewed.   Results for orders placed or performed in visit on 04/29/18  CBC with Differential/Platelet  Result Value Ref Range   WBC 7.1 3.4 - 10.8 x10E3/uL   RBC 4.61 4.14 - 5.80 x10E6/uL   Hemoglobin 14.0 13.0 -  17.7 g/dL   Hematocrit 16.1 09.6 - 51.0 %   MCV 89 79 - 97 fL   MCH 30.4 26.6 - 33.0 pg   MCHC 34.0 31.5 - 35.7 g/dL   RDW 04.5 40.9 - 81.1 %   Platelets 213 150 - 450 x10E3/uL   Neutrophils 57 Not Estab. %   Lymphs 35 Not Estab. %   Monocytes 8 Not Estab. %   Eos 0 Not Estab. %   Basos 0 Not Estab. %   Neutrophils Absolute 4.0 1.4 - 7.0 x10E3/uL   Lymphocytes Absolute 2.5 0.7 - 3.1 x10E3/uL   Monocytes Absolute 0.6 0.1 - 0.9 x10E3/uL   EOS (ABSOLUTE) 0.0 0.0 - 0.4 x10E3/uL   Basophils Absolute 0.0 0.0 - 0.2 x10E3/uL   Immature Granulocytes 0 Not Estab. %   Immature Grans (Abs) 0.0 0.0 - 0.1 x10E3/uL  Comprehensive metabolic panel  Result Value Ref Range   Glucose 64 (L) 65 - 99 mg/dL   BUN 21 (H) 6 - 20 mg/dL   Creatinine, Ser 9.14 0.76 - 1.27 mg/dL   GFR calc non Af Amer 112 >59 mL/min/1.73   GFR calc Af Amer 129 >59 mL/min/1.73   BUN/Creatinine Ratio 22 (H) 9 - 20   Sodium 140 134 - 144 mmol/L   Potassium 4.1 3.5 - 5.2 mmol/L   Chloride 102 96 - 106 mmol/L   CO2 22 20 - 29 mmol/L   Calcium 9.8 8.7 - 10.2 mg/dL   Total Protein 7.3 6.0 - 8.5 g/dL   Albumin 4.8 3.5 - 5.5 g/dL   Globulin, Total 2.5 1.5 - 4.5 g/dL   Albumin/Globulin Ratio 1.9 1.2 - 2.2   Bilirubin Total 0.2 0.0 - 1.2 mg/dL   Alkaline Phosphatase 69 39 - 117 IU/L   AST 32 0 - 40 IU/L   ALT 21 0 - 44 IU/L  Lipid Panel w/o Chol/HDL Ratio  Result Value Ref Range   Cholesterol, Total 159 100 - 199 mg/dL   Triglycerides 782 0 - 149 mg/dL   HDL 58 >95 mg/dL   VLDL Cholesterol Cal 29 5 - 40 mg/dL   LDL Calculated 72 0 - 99 mg/dL  TSH   Result Value Ref Range   TSH 1.440 0.450 - 4.500 uIU/mL  UA/M w/rflx Culture, Routine  Result Value Ref Range   Specific Gravity, UA 1.010 1.005 - 1.030   pH, UA 6.0 5.0 - 7.5   Color, UA Yellow Yellow   Appearance Ur Clear Clear   Leukocytes, UA Negative Negative   Protein, UA Negative Negative/Trace   Glucose, UA Negative Negative   Ketones, UA Negative Negative   RBC, UA Negative Negative   Bilirubin, UA Negative Negative   Urobilinogen, Ur 0.2 0.2 - 1.0 mg/dL   Nitrite, UA Negative Negative  Urine drugs of abuse scrn w alc, routine (Ref Lab)  Result Value Ref Range   Amphetamines, Urine Negative Cutoff=1000 ng/mL   Barbiturate Quant, Ur Negative Cutoff=300 ng/mL   Benzodiazepine Quant, Ur Negative Cutoff=300 ng/mL   Cannabinoid Quant, Ur Negative Cutoff=50 ng/mL   Cocaine (Metab.) Negative Cutoff=300 ng/mL   Opiate Quant, Ur Negative Cutoff=300 ng/mL   PCP Quant, Ur Negative Cutoff=25 ng/mL   Methadone Screen, Urine Negative Cutoff=300 ng/mL   Propoxyphene Negative Cutoff=300 ng/mL   Ethanol, Urine Negative Cutoff=0.020 %      Assessment & Plan:   Problem List Items Addressed This Visit      Other   ADHD - Primary  No benefit with strattera even with dose increases. Will start back on adderall XR and monitor for improvement. Watch closely for worsened anxiety, insomnia.       Insomnia    Increase trazodone to 150 mg as he's still having difficulty about 1/4 of the nights.       Controlled substance agreement signed   Relevant Orders   Urine drugs of abuse scrn w alc, routine (Ref Lab) (Completed)    Other Visit Diagnoses    Physical exam       Relevant Orders   CBC with Differential/Platelet (Completed)   Comprehensive metabolic panel (Completed)   Lipid Panel w/o Chol/HDL Ratio (Completed)   TSH (Completed)   UA/M w/rflx Culture, Routine (Completed)       Discussed aspirin prophylaxis for myocardial infarction prevention and decision was it was not  indicated  LABORATORY TESTING:  Health maintenance labs ordered today as discussed above.   The natural history of prostate cancer and ongoing controversy regarding screening and potential treatment outcomes of prostate cancer has been discussed with the patient. The meaning of a false positive PSA and a false negative PSA has been discussed. He indicates understanding of the limitations of this screening test and wishes not to proceed with screening PSA testing.   IMMUNIZATIONS:   - Tdap: Tetanus vaccination status reviewed: last tetanus booster within 10 years. - Influenza: Postponed to flu season  PATIENT COUNSELING:    Sexuality: Discussed sexually transmitted diseases, partner selection, use of condoms, avoidance of unintended pregnancy  and contraceptive alternatives.   Advised to avoid cigarette smoking.  I discussed with the patient that most people either abstain from alcohol or drink within safe limits (<=14/week and <=4 drinks/occasion for males, <=7/weeks and <= 3 drinks/occasion for females) and that the risk for alcohol disorders and other health effects rises proportionally with the number of drinks per week and how often a drinker exceeds daily limits.  Discussed cessation/primary prevention of drug use and availability of treatment for abuse.   Diet: Encouraged to adjust caloric intake to maintain  or achieve ideal body weight, to reduce intake of dietary saturated fat and total fat, to limit sodium intake by avoiding high sodium foods and not adding table salt, and to maintain adequate dietary potassium and calcium preferably from fresh fruits, vegetables, and low-fat dairy products.    stressed the importance of regular exercise  Injury prevention: Discussed safety belts, safety helmets, smoke detector, smoking near bedding or upholstery.   Dental health: Discussed importance of regular tooth brushing, flossing, and dental visits.   Follow up plan: NEXT PREVENTATIVE  PHYSICAL DUE IN 1 YEAR. Return in about 4 weeks (around 05/27/2018) for ADHD.

## 2018-04-30 LAB — COMPREHENSIVE METABOLIC PANEL
A/G RATIO: 1.9 (ref 1.2–2.2)
ALT: 21 IU/L (ref 0–44)
AST: 32 IU/L (ref 0–40)
Albumin: 4.8 g/dL (ref 3.5–5.5)
Alkaline Phosphatase: 69 IU/L (ref 39–117)
BUN/Creatinine Ratio: 22 — ABNORMAL HIGH (ref 9–20)
BUN: 21 mg/dL — ABNORMAL HIGH (ref 6–20)
Bilirubin Total: 0.2 mg/dL (ref 0.0–1.2)
CO2: 22 mmol/L (ref 20–29)
CREATININE: 0.96 mg/dL (ref 0.76–1.27)
Calcium: 9.8 mg/dL (ref 8.7–10.2)
Chloride: 102 mmol/L (ref 96–106)
GFR calc Af Amer: 129 mL/min/{1.73_m2} (ref >59)
GFR calc non Af Amer: 112 mL/min/{1.73_m2} (ref >59)
Globulin, Total: 2.5 g/dL (ref 1.5–4.5)
Glucose: 64 mg/dL — ABNORMAL LOW (ref 65–99)
POTASSIUM: 4.1 mmol/L (ref 3.5–5.2)
Sodium: 140 mmol/L (ref 134–144)
TOTAL PROTEIN: 7.3 g/dL (ref 6.0–8.5)

## 2018-04-30 LAB — CBC WITH DIFFERENTIAL/PLATELET
BASOS: 0 %
Basophils Absolute: 0 10*3/uL (ref 0.0–0.2)
EOS (ABSOLUTE): 0 10*3/uL (ref 0.0–0.4)
EOS: 0 %
HEMATOCRIT: 41.2 % (ref 37.5–51.0)
Hemoglobin: 14 g/dL (ref 13.0–17.7)
IMMATURE GRANS (ABS): 0 10*3/uL (ref 0.0–0.1)
IMMATURE GRANULOCYTES: 0 %
LYMPHS: 35 %
Lymphocytes Absolute: 2.5 10*3/uL (ref 0.7–3.1)
MCH: 30.4 pg (ref 26.6–33.0)
MCHC: 34 g/dL (ref 31.5–35.7)
MCV: 89 fL (ref 79–97)
Monocytes Absolute: 0.6 10*3/uL (ref 0.1–0.9)
Monocytes: 8 %
NEUTROS PCT: 57 %
Neutrophils Absolute: 4 10*3/uL (ref 1.4–7.0)
PLATELETS: 213 10*3/uL (ref 150–450)
RBC: 4.61 x10E6/uL (ref 4.14–5.80)
RDW: 12.8 % (ref 12.3–15.4)
WBC: 7.1 10*3/uL (ref 3.4–10.8)

## 2018-04-30 LAB — URINE DRUGS OF ABUSE SCREEN W ALC, ROUTINE (REF LAB)
AMPHETAMINES, URINE: NEGATIVE ng/mL
BARBITURATE QUANT UR: NEGATIVE ng/mL
BENZODIAZEPINE QUANT UR: NEGATIVE ng/mL
Cannabinoid Quant, Ur: NEGATIVE ng/mL
Cocaine (Metab.): NEGATIVE ng/mL
Ethanol, Urine: NEGATIVE %
Methadone Screen, Urine: NEGATIVE ng/mL
OPIATE QUANT UR: NEGATIVE ng/mL
PCP QUANT UR: NEGATIVE ng/mL
PROPOXYPHENE: NEGATIVE ng/mL

## 2018-04-30 LAB — LIPID PANEL W/O CHOL/HDL RATIO
Cholesterol, Total: 159 mg/dL (ref 100–199)
HDL: 58 mg/dL (ref >39)
LDL CALC: 72 mg/dL (ref 0–99)
TRIGLYCERIDES: 144 mg/dL (ref 0–149)
VLDL CHOLESTEROL CAL: 29 mg/dL (ref 5–40)

## 2018-04-30 LAB — TSH: TSH: 1.44 u[IU]/mL (ref 0.450–4.500)

## 2018-05-01 DIAGNOSIS — Z79899 Other long term (current) drug therapy: Secondary | ICD-10-CM | POA: Insufficient documentation

## 2018-05-01 NOTE — Assessment & Plan Note (Signed)
No benefit with strattera even with dose increases. Will start back on adderall XR and monitor for improvement. Watch closely for worsened anxiety, insomnia.

## 2018-05-01 NOTE — Patient Instructions (Signed)
Follow up in 1 month   

## 2018-05-01 NOTE — Assessment & Plan Note (Signed)
Increase trazodone to 150 mg as he's still having difficulty about 1/4 of the nights.

## 2018-05-02 ENCOUNTER — Encounter: Payer: Self-pay | Admitting: Family Medicine

## 2018-05-03 ENCOUNTER — Encounter: Payer: Self-pay | Admitting: Family Medicine

## 2018-05-04 ENCOUNTER — Other Ambulatory Visit: Payer: Self-pay | Admitting: Family Medicine

## 2018-05-04 MED ORDER — FLUCONAZOLE 150 MG PO TABS: 150.0000 mg | ORAL_TABLET | Freq: Once | ORAL | 0 refills | Status: AC

## 2018-05-04 MED ORDER — TRIAMCINOLONE ACETONIDE 0.025 % EX OINT: 1.0000 "application " | TOPICAL_OINTMENT | Freq: Two times a day (BID) | CUTANEOUS | 0 refills | Status: DC

## 2018-05-24 ENCOUNTER — Other Ambulatory Visit: Payer: Self-pay | Admitting: Family Medicine

## 2018-06-03 ENCOUNTER — Ambulatory Visit: Payer: PRIVATE HEALTH INSURANCE | Admitting: Family Medicine

## 2018-06-03 ENCOUNTER — Encounter: Payer: Self-pay | Admitting: Family Medicine

## 2018-06-03 ENCOUNTER — Other Ambulatory Visit: Payer: Self-pay

## 2018-06-03 VITALS — BP 129/65 | HR 47 | Temp 98.5°F | Ht 66.0 in | Wt 162.5 lb

## 2018-06-03 DIAGNOSIS — G4700 Insomnia, unspecified: Secondary | ICD-10-CM | POA: Diagnosis not present

## 2018-06-03 DIAGNOSIS — F909 Attention-deficit hyperactivity disorder, unspecified type: Secondary | ICD-10-CM

## 2018-06-03 MED ORDER — AMPHETAMINE-DEXTROAMPHET ER 20 MG PO CP24: 20.0000 mg | ORAL_CAPSULE | ORAL | 0 refills | Status: DC

## 2018-06-03 NOTE — Assessment & Plan Note (Signed)
Will increase to 20 mg adderall XR and monitor for improvement. Notes significant benefit with his focus and ability to complete his work.

## 2018-06-03 NOTE — Progress Notes (Signed)
BP 129/65   Pulse (!) 47   Temp 98.5 F (36.9 C) (Oral)   Ht 5\' 6"  (1.676 m)   Wt 162 lb 8 oz (73.7 kg)   SpO2 99%   BMI 26.23 kg/m    Subjective:    Patient ID: Dillon Sosa, male    DOB: 1995/11/28, 22 y.o.   MRN: [id number]  HPI: Dillon Sosa is a 22 y.o. male  Chief Complaint  Patient presents with  . ADHD    f/u   Here today for ADHD and insomnia f/u.   Significant sleep improvement with dose increase on the trazodone. Not groggy in the mornings, resting well consistently. No side effects reported.   Feels the adderall 15 mg is helping but could be a bit stronger. 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 Denies CP, palpitations, appetite issues.   Relevant past medical, surgical, family and social history reviewed and updated as indicated. Interim medical history since our last visit reviewed. Allergies and medications reviewed and updated.  Review of Systems  Per HPI unless specifically indicated above     Objective:    BP 129/65   Pulse (!) 47   Temp 98.5 F (36.9 C) (Oral)   Ht 5\' 6"  (1.676 m)   Wt 162 lb 8 oz (73.7 kg)   SpO2 99%   BMI 26.23 kg/m   Wt Readings from Last 3 Encounters:  06/03/18 162 lb 8 oz (73.7 kg)  04/29/18 166 lb 3.2 oz (75.4 kg)  02/17/18 161 lb 3.2 oz (73.1 kg)    Physical Exam  Constitutional: He is oriented to person, place, and time. He appears well-developed and well-nourished. No distress.  HENT:  Head: Atraumatic.  Eyes: Conjunctivae and EOM are normal.  Neck: Normal range of motion. Neck supple.  Cardiovascular: Normal rate and regular rhythm.  Pulmonary/Chest: Effort normal and breath sounds normal.  Musculoskeletal: Normal range of motion.  Neurological: He is alert and oriented to person, place, and time.  Skin: Skin is warm and dry.  Psychiatric: He has a normal mood and affect. His behavior is  normal. Thought content normal.  Nursing note and vitals reviewed.   Results for orders placed or performed in visit on 04/29/18  CBC with Differential/Platelet  Result Value Ref Range   WBC 7.1 3.4 - 10.8 x10E3/uL   RBC 4.61 4.14 - 5.80 x10E6/uL   Hemoglobin 14.0 13.0 - 17.7 g/dL   Hematocrit 40.941.2 81.137.5 - 51.0 %   MCV 89 79 - 97 fL   MCH 30.4 26.6 - 33.0 pg   MCHC 34.0 31.5 - 35.7 g/dL   RDW 91.412.8 78.212.3 - 95.615.4 %   Platelets 213 150 - 450 x10E3/uL   Neutrophils 57 Not Estab. %   Lymphs 35 Not Estab. %   Monocytes 8 Not Estab. %   Eos 0 Not Estab. %   Basos 0 Not Estab. %   Neutrophils Absolute 4.0 1.4 - 7.0 x10E3/uL   Lymphocytes Absolute 2.5 0.7 - 3.1 x10E3/uL   Monocytes Absolute 0.6 0.1 - 0.9 x10E3/uL   EOS (ABSOLUTE) 0.0 0.0 - 0.4 x10E3/uL   Basophils Absolute 0.0 0.0 - 0.2 x10E3/uL   Immature Granulocytes 0 Not Estab. %   Immature Grans (Abs) 0.0 0.0 - 0.1 x10E3/uL  Comprehensive metabolic panel  Result Value Ref Range   Glucose 64 (L) 65 - 99 mg/dL  BUN 21 (H) 6 - 20 mg/dL   Creatinine, Ser 1.61 0.76 - 1.27 mg/dL   GFR calc non Af Amer 112 >59 mL/min/1.73   GFR calc Af Amer 129 >59 mL/min/1.73   BUN/Creatinine Ratio 22 (H) 9 - 20   Sodium 140 134 - 144 mmol/L   Potassium 4.1 3.5 - 5.2 mmol/L   Chloride 102 96 - 106 mmol/L   CO2 22 20 - 29 mmol/L   Calcium 9.8 8.7 - 10.2 mg/dL   Total Protein 7.3 6.0 - 8.5 g/dL   Albumin 4.8 3.5 - 5.5 g/dL   Globulin, Total 2.5 1.5 - 4.5 g/dL   Albumin/Globulin Ratio 1.9 1.2 - 2.2   Bilirubin Total 0.2 0.0 - 1.2 mg/dL   Alkaline Phosphatase 69 39 - 117 IU/L   AST 32 0 - 40 IU/L   ALT 21 0 - 44 IU/L  Lipid Panel w/o Chol/HDL Ratio  Result Value Ref Range   Cholesterol, Total 159 100 - 199 mg/dL   Triglycerides 096 0 - 149 mg/dL   HDL 58 >04 mg/dL   VLDL Cholesterol Cal 29 5 - 40 mg/dL   LDL Calculated 72 0 - 99 mg/dL  TSH  Result Value Ref Range   TSH 1.440 0.450 - 4.500 uIU/mL  UA/M w/rflx Culture, Routine  Result Value  Ref Range   Specific Gravity, UA 1.010 1.005 - 1.030   pH, UA 6.0 5.0 - 7.5   Color, UA Yellow Yellow   Appearance Ur Clear Clear   Leukocytes, UA Negative Negative   Protein, UA Negative Negative/Trace   Glucose, UA Negative Negative   Ketones, UA Negative Negative   RBC, UA Negative Negative   Bilirubin, UA Negative Negative   Urobilinogen, Ur 0.2 0.2 - 1.0 mg/dL   Nitrite, UA Negative Negative  Urine drugs of abuse scrn w alc, routine (Ref Lab)  Result Value Ref Range   Amphetamines, Urine Negative Cutoff=1000 ng/mL   Barbiturate Quant, Ur Negative Cutoff=300 ng/mL   Benzodiazepine Quant, Ur Negative Cutoff=300 ng/mL   Cannabinoid Quant, Ur Negative Cutoff=50 ng/mL   Cocaine (Metab.) Negative Cutoff=300 ng/mL   Opiate Quant, Ur Negative Cutoff=300 ng/mL   PCP Quant, Ur Negative Cutoff=25 ng/mL   Methadone Screen, Urine Negative Cutoff=300 ng/mL   Propoxyphene Negative Cutoff=300 ng/mL   Ethanol, Urine Negative Cutoff=0.020 %      Assessment & Plan:   Problem List Items Addressed This Visit      Other   ADHD - Primary    Will increase to 20 mg adderall XR and monitor for improvement. Notes significant benefit with his focus and ability to complete his work.       Insomnia    Improved on 150 mg trazodone. Continue current regimen          Follow up plan: Return in about 3 months (around 09/02/2018) for ADHD.

## 2018-06-03 NOTE — Patient Instructions (Signed)
Follow up in 3 months

## 2018-06-03 NOTE — Assessment & Plan Note (Signed)
Improved on 150 mg trazodone. Continue current regimen

## 2018-06-06 ENCOUNTER — Ambulatory Visit: Payer: Self-pay | Admitting: Family Medicine

## 2018-07-04 ENCOUNTER — Other Ambulatory Visit: Payer: Self-pay | Admitting: Family Medicine

## 2018-07-05 ENCOUNTER — Encounter: Payer: Self-pay | Admitting: Family Medicine

## 2018-07-05 NOTE — Telephone Encounter (Signed)
Spoke with pharmacy. They do have the script. Will refuse this one.

## 2018-07-05 NOTE — Telephone Encounter (Signed)
He should have had a refill available 10/20 - can you check with the pharmacy? Happy to re-send if they didn't get it

## 2018-08-12 ENCOUNTER — Encounter: Payer: Self-pay | Admitting: Family Medicine

## 2018-08-15 ENCOUNTER — Other Ambulatory Visit: Payer: Self-pay | Admitting: Family Medicine

## 2018-08-15 MED ORDER — QUETIAPINE FUMARATE 50 MG PO TABS: ORAL_TABLET | ORAL | 0 refills | Status: DC

## 2018-08-17 ENCOUNTER — Encounter: Payer: Self-pay | Admitting: Family Medicine

## 2018-08-18 ENCOUNTER — Other Ambulatory Visit: Payer: Self-pay | Admitting: Family Medicine

## 2018-08-18 MED ORDER — TRAZODONE HCL 150 MG PO TABS: 150.0000 mg | ORAL_TABLET | Freq: Every day | ORAL | 0 refills | Status: DC

## 2018-08-18 MED ORDER — TRAZODONE HCL 50 MG PO TABS: 25.0000 mg | ORAL_TABLET | Freq: Every evening | ORAL | 0 refills | Status: DC | PRN

## 2018-09-02 ENCOUNTER — Telehealth: Payer: Self-pay

## 2018-09-02 ENCOUNTER — Ambulatory Visit: Payer: PRIVATE HEALTH INSURANCE | Admitting: Family Medicine

## 2018-09-02 ENCOUNTER — Encounter: Payer: Self-pay | Admitting: Family Medicine

## 2018-09-02 VITALS — BP 126/73 | HR 79 | Temp 98.7°F | Ht 66.0 in | Wt 166.0 lb

## 2018-09-02 DIAGNOSIS — G4700 Insomnia, unspecified: Secondary | ICD-10-CM

## 2018-09-02 DIAGNOSIS — F909 Attention-deficit hyperactivity disorder, unspecified type: Secondary | ICD-10-CM | POA: Diagnosis not present

## 2018-09-02 MED ORDER — AMPHETAMINE-DEXTROAMPHET ER 20 MG PO CP24: 20.0000 mg | ORAL_CAPSULE | ORAL | 0 refills | Status: DC

## 2018-09-02 MED ORDER — SUVOREXANT 5 MG PO TABS: 5.0000 mg | ORAL_TABLET | Freq: Every evening | ORAL | 0 refills | Status: DC | PRN

## 2018-09-02 NOTE — Assessment & Plan Note (Signed)
Stable, continue adderall 20 mg XR

## 2018-09-02 NOTE — Telephone Encounter (Signed)
Copied from CRM [phone]. Topic: General - Other >> Sep 02, 2018  2:42 PM Tamela OddiHarris, Brenda J wrote: Reason for CRM: Patient called to get a PA from the doctor for his medication, Suvorexant (BELSOMRA) 5 MG TABS.  Patient stated that the pharmacy said he would need the PA before the prescription could be filled.  Please advise and call patient when completed.  CB# [phone]   PA submitted via Cover My Meds. Will await determination.

## 2018-09-02 NOTE — Progress Notes (Signed)
BP 126/73   Pulse 79   Temp 98.7 F (37.1 C) (Oral)   Ht 5\' 6"  (1.676 m)   Wt 166 lb (75.3 kg)   SpO2 98%   BMI 26.79 kg/m    Subjective:    Patient ID: Dillon Sosa, male    DOB: Aug 02, 1996, 22 y.o.   MRN: [id number]  HPI: Dillon Sosa is a 22 y.o. male  Chief Complaint  Patient presents with  . ADHD   Here today for ADHD and insomnia follow up.   Feeling well on current adderall dose of 20 mg XR. Focus significantly improved as is his performance at school. Lasting through the day from school to work well.   Still struggling with sleep despite trial of seroquel which he tolerated very poorly and increasing trazodone to 200 mg. Typically can fall asleep but does not stay asleep. OTC medications not helpful for him. This has been ongoing for years.   Past Medical History:  Diagnosis Date  . ADHD   . Insomnia    Social History   Socioeconomic History  . Marital status: Single    Spouse name: Not on file  . Number of children: Not on file  . Years of education: Not on file  . Highest education level: Not on file  Occupational History  . Not on file  Social Needs  . Financial resource strain: Not on file  . Food insecurity:    Worry: Not on file    Inability: Not on file  . Transportation needs:    Medical: Not on file    Non-medical: Not on file  Tobacco Use  . Smoking status: Former Smoker    Years: 2.00    Types: Cigarettes  . Smokeless tobacco: Former Neurosurgeon    Types: Chew    Quit date: 10/15/2017  Substance and Sexual Activity  . Alcohol use: Not Currently  . Drug use: No  . Sexual activity: Yes  Lifestyle  . Physical activity:    Days per week: Not on file    Minutes per session: Not on file  . Stress: Not on file  Relationships  . Social connections:    Talks on phone: Not on file    Gets together: Not on file    Attends religious service: Not on file    Active member of club or organization: Not on file    Attends meetings of  clubs or organizations: Not on file    Relationship status: Not on file  . Intimate partner violence:    Fear of current or ex partner: Not on file    Emotionally abused: Not on file    Physically abused: Not on file    Forced sexual activity: Not on file  Other Topics Concern  . Not on file  Social History Narrative  . Not on file    Relevant past medical, surgical, family and social history reviewed and updated as indicated. Interim medical history since our last visit reviewed. Allergies and medications reviewed and updated.  Review of Systems  Per HPI unless specifically indicated above     Objective:    BP 126/73   Pulse 79   Temp 98.7 F (37.1 C) (Oral)   Ht 5\' 6"  (1.676 m)   Wt 166 lb (75.3 kg)   SpO2 98%   BMI 26.79 kg/m   Wt Readings from Last 3 Encounters:  09/02/18 166 lb (75.3 kg)  06/03/18 162 lb 8 oz (73.7 kg)  04/29/18 166 lb 3.2 oz (75.4 kg)    Physical Exam Vitals signs and nursing note reviewed.  Constitutional:      Appearance: Normal appearance.  HENT:     Head: Atraumatic.  Eyes:     Extraocular Movements: Extraocular movements intact.     Conjunctiva/sclera: Conjunctivae normal.  Neck:     Musculoskeletal: Normal range of motion and neck supple.  Cardiovascular:     Rate and Rhythm: Normal rate and regular rhythm.  Pulmonary:     Effort: Pulmonary effort is normal.     Breath sounds: Normal breath sounds.  Musculoskeletal: Normal range of motion.  Skin:    General: Skin is warm and dry.  Neurological:     General: No focal deficit present.     Mental Status: He is oriented to person, place, and time.  Psychiatric:        Mood and Affect: Mood normal.        Thought Content: Thought content normal.        Judgment: Judgment normal.     Results for orders placed or performed in visit on 04/29/18  CBC with Differential/Platelet  Result Value Ref Range   WBC 7.1 3.4 - 10.8 x10E3/uL   RBC 4.61 4.14 - 5.80 x10E6/uL   Hemoglobin  14.0 13.0 - 17.7 g/dL   Hematocrit 45.441.2 09.837.5 - 51.0 %   MCV 89 79 - 97 fL   MCH 30.4 26.6 - 33.0 pg   MCHC 34.0 31.5 - 35.7 g/dL   RDW 11.912.8 14.712.3 - 82.915.4 %   Platelets 213 150 - 450 x10E3/uL   Neutrophils 57 Not Estab. %   Lymphs 35 Not Estab. %   Monocytes 8 Not Estab. %   Eos 0 Not Estab. %   Basos 0 Not Estab. %   Neutrophils Absolute 4.0 1.4 - 7.0 x10E3/uL   Lymphocytes Absolute 2.5 0.7 - 3.1 x10E3/uL   Monocytes Absolute 0.6 0.1 - 0.9 x10E3/uL   EOS (ABSOLUTE) 0.0 0.0 - 0.4 x10E3/uL   Basophils Absolute 0.0 0.0 - 0.2 x10E3/uL   Immature Granulocytes 0 Not Estab. %   Immature Grans (Abs) 0.0 0.0 - 0.1 x10E3/uL  Comprehensive metabolic panel  Result Value Ref Range   Glucose 64 (L) 65 - 99 mg/dL   BUN 21 (H) 6 - 20 mg/dL   Creatinine, Ser 5.620.96 0.76 - 1.27 mg/dL   GFR calc non Af Amer 112 >59 mL/min/1.73   GFR calc Af Amer 129 >59 mL/min/1.73   BUN/Creatinine Ratio 22 (H) 9 - 20   Sodium 140 134 - 144 mmol/L   Potassium 4.1 3.5 - 5.2 mmol/L   Chloride 102 96 - 106 mmol/L   CO2 22 20 - 29 mmol/L   Calcium 9.8 8.7 - 10.2 mg/dL   Total Protein 7.3 6.0 - 8.5 g/dL   Albumin 4.8 3.5 - 5.5 g/dL   Globulin, Total 2.5 1.5 - 4.5 g/dL   Albumin/Globulin Ratio 1.9 1.2 - 2.2   Bilirubin Total 0.2 0.0 - 1.2 mg/dL   Alkaline Phosphatase 69 39 - 117 IU/L   AST 32 0 - 40 IU/L   ALT 21 0 - 44 IU/L  Lipid Panel w/o Chol/HDL Ratio  Result Value Ref Range   Cholesterol, Total 159 100 - 199 mg/dL   Triglycerides 130144 0 - 149 mg/dL   HDL 58 >86>39 mg/dL   VLDL Cholesterol Cal 29 5 - 40 mg/dL   LDL Calculated 72 0 - 99 mg/dL  TSH  Result Value Ref Range   TSH 1.440 0.450 - 4.500 uIU/mL  UA/M w/rflx Culture, Routine  Result Value Ref Range   Specific Gravity, UA 1.010 1.005 - 1.030   pH, UA 6.0 5.0 - 7.5   Color, UA Yellow Yellow   Appearance Ur Clear Clear   Leukocytes, UA Negative Negative   Protein, UA Negative Negative/Trace   Glucose, UA Negative Negative   Ketones, UA Negative  Negative   RBC, UA Negative Negative   Bilirubin, UA Negative Negative   Urobilinogen, Ur 0.2 0.2 - 1.0 mg/dL   Nitrite, UA Negative Negative  Urine drugs of abuse scrn w alc, routine (Ref Lab)  Result Value Ref Range   Amphetamines, Urine Negative Cutoff=1000 ng/mL   Barbiturate Quant, Ur Negative Cutoff=300 ng/mL   Benzodiazepine Quant, Ur Negative Cutoff=300 ng/mL   Cannabinoid Quant, Ur Negative Cutoff=50 ng/mL   Cocaine (Metab.) Negative Cutoff=300 ng/mL   Opiate Quant, Ur Negative Cutoff=300 ng/mL   PCP Quant, Ur Negative Cutoff=25 ng/mL   Methadone Screen, Urine Negative Cutoff=300 ng/mL   Propoxyphene Negative Cutoff=300 ng/mL   Ethanol, Urine Negative Cutoff=0.020 %      Assessment & Plan:   Problem List Items Addressed This Visit      Other   ADHD - Primary    Stable, continue adderall 20 mg XR      Insomnia    Failed seroquel d/t side effects, and not having good sleep with increased trazodone to 200 mg. Will trial belsomra in conjunction with good sleep hygiene. Risks and benefits reviewed          Follow up plan: Return in about 3 months (around 12/02/2018) for ADHD, insomnia.

## 2018-09-02 NOTE — Assessment & Plan Note (Signed)
Failed seroquel d/t side effects, and not having good sleep with increased trazodone to 200 mg. Will trial belsomra in conjunction with good sleep hygiene. Risks and benefits reviewed

## 2018-09-05 NOTE — Telephone Encounter (Signed)
PA was denied by patient's insurance. Routing to provider to advise.

## 2018-09-06 ENCOUNTER — Other Ambulatory Visit: Payer: Self-pay | Admitting: Family Medicine

## 2018-09-06 ENCOUNTER — Encounter: Payer: Self-pay | Admitting: Family Medicine

## 2018-09-06 MED ORDER — ZOLPIDEM TARTRATE 5 MG PO TABS: 5.0000 mg | ORAL_TABLET | Freq: Every evening | ORAL | 0 refills | Status: DC | PRN

## 2018-09-06 MED ORDER — TRAZODONE HCL 100 MG PO TABS: 200.0000 mg | ORAL_TABLET | Freq: Every evening | ORAL | 1 refills | Status: DC | PRN

## 2018-09-06 NOTE — Telephone Encounter (Signed)
Discussed belsomra denial with patient - he would like to try prn ambien for bad days and continue his trazodone. We will recheck in 1 month. Risks and addictive potential reviewed with patient at length, will try getting belsomra covered again if not doing well on ambien.

## 2018-09-11 ENCOUNTER — Other Ambulatory Visit: Payer: Self-pay | Admitting: Family Medicine

## 2018-09-20 ENCOUNTER — Encounter: Payer: Self-pay | Admitting: Family Medicine

## 2018-09-22 ENCOUNTER — Other Ambulatory Visit: Payer: Self-pay | Admitting: Family Medicine

## 2018-09-22 DIAGNOSIS — G4700 Insomnia, unspecified: Secondary | ICD-10-CM

## 2018-09-22 MED ORDER — ZOLPIDEM TARTRATE 5 MG PO TABS: 5.0000 mg | ORAL_TABLET | Freq: Every evening | ORAL | 0 refills | Status: DC | PRN

## 2018-10-07 ENCOUNTER — Encounter: Payer: Self-pay | Admitting: Family Medicine

## 2018-10-07 ENCOUNTER — Ambulatory Visit: Payer: PRIVATE HEALTH INSURANCE | Admitting: Family Medicine

## 2018-10-07 VITALS — BP 118/69 | HR 49 | Temp 98.5°F | Wt 166.1 lb

## 2018-10-07 DIAGNOSIS — G4700 Insomnia, unspecified: Secondary | ICD-10-CM | POA: Diagnosis not present

## 2018-10-07 MED ORDER — ZOLPIDEM TARTRATE 5 MG PO TABS: 5.0000 mg | ORAL_TABLET | Freq: Every evening | ORAL | 0 refills | Status: DC | PRN

## 2018-10-07 NOTE — Progress Notes (Signed)
BP 118/69   Pulse (!) 49   Temp 98.5 F (36.9 C) (Oral)   Wt 166 lb 1.6 oz (75.3 kg)   SpO2 98%   BMI 26.81 kg/m    Subjective:    Patient ID: Dillon Sosa, male    DOB: Sep 10, 1996, 23 y.o.   MRN: [id number]  HPI: Dillon Sosa is a 23 y.o. male  Chief Complaint  Patient presents with  . Insomnia    pt states the Ambien is working for him   Here today for insomnia f/u. Currently taking 200 mg trazodone nightly with prn 5 mg ambien. States this is going pretty well for him and overall he gets good sleep. Has tried and failed belsomra, OTC sleep remedies, and seroquel in the past. 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 Trying to do meditation and other home remedies as well, and exercises regularly and eats well.   Relevant past medical, surgical, family and social history reviewed and updated as indicated. Interim medical history since our last visit reviewed. Allergies and medications reviewed and updated.  Review of Systems  Per HPI unless specifically indicated above     Objective:    BP 118/69   Pulse (!) 49   Temp 98.5 F (36.9 C) (Oral)   Wt 166 lb 1.6 oz (75.3 kg)   SpO2 98%   BMI 26.81 kg/m   Wt Readings from Last 3 Encounters:  10/07/18 166 lb 1.6 oz (75.3 kg)  09/02/18 166 lb (75.3 kg)  06/03/18 162 lb 8 oz (73.7 kg)    Physical Exam Vitals signs and nursing note reviewed.  Constitutional:      Appearance: Normal appearance.  HENT:     Head: Atraumatic.  Eyes:     Extraocular Movements: Extraocular movements intact.     Conjunctiva/sclera: Conjunctivae normal.  Neck:     Musculoskeletal: Normal range of motion and neck supple.  Cardiovascular:     Rate and Rhythm: Normal rate and regular rhythm.  Pulmonary:     Effort: Pulmonary effort is normal.     Breath sounds: Normal breath sounds.  Musculoskeletal: Normal range of motion.  Skin:    General:  Skin is warm and dry.  Neurological:     General: No focal deficit present.     Mental Status: He is oriented to person, place, and time.  Psychiatric:        Mood and Affect: Mood normal.        Thought Content: Thought content normal.        Judgment: Judgment normal.     Results for orders placed or performed in visit on 04/29/18  CBC with Differential/Platelet  Result Value Ref Range   WBC 7.1 3.4 - 10.8 x10E3/uL   RBC 4.61 4.14 - 5.80 x10E6/uL   Hemoglobin 14.0 13.0 - 17.7 g/dL   Hematocrit 52.8 41.3 - 51.0 %   MCV 89 79 - 97 fL   MCH 30.4 26.6 - 33.0 pg   MCHC 34.0 31.5 - 35.7 g/dL   RDW 24.4 01.0 - 27.2 %   Platelets 213 150 - 450 x10E3/uL   Neutrophils 57 Not Estab. %   Lymphs 35 Not Estab. %   Monocytes 8 Not Estab. %   Eos 0 Not Estab. %   Basos 0 Not Estab. %   Neutrophils Absolute 4.0 1.4 - 7.0 x10E3/uL   Lymphocytes Absolute 2.5 0.7 - 3.1 x10E3/uL  Monocytes Absolute 0.6 0.1 - 0.9 x10E3/uL   EOS (ABSOLUTE) 0.0 0.0 - 0.4 x10E3/uL   Basophils Absolute 0.0 0.0 - 0.2 x10E3/uL   Immature Granulocytes 0 Not Estab. %   Immature Grans (Abs) 0.0 0.0 - 0.1 x10E3/uL  Comprehensive metabolic panel  Result Value Ref Range   Glucose 64 (L) 65 - 99 mg/dL   BUN 21 (H) 6 - 20 mg/dL   Creatinine, Ser 7.04 0.76 - 1.27 mg/dL   GFR calc non Af Amer 112 >59 mL/min/1.73   GFR calc Af Amer 129 >59 mL/min/1.73   BUN/Creatinine Ratio 22 (H) 9 - 20   Sodium 140 134 - 144 mmol/L   Potassium 4.1 3.5 - 5.2 mmol/L   Chloride 102 96 - 106 mmol/L   CO2 22 20 - 29 mmol/L   Calcium 9.8 8.7 - 10.2 mg/dL   Total Protein 7.3 6.0 - 8.5 g/dL   Albumin 4.8 3.5 - 5.5 g/dL   Globulin, Total 2.5 1.5 - 4.5 g/dL   Albumin/Globulin Ratio 1.9 1.2 - 2.2   Bilirubin Total 0.2 0.0 - 1.2 mg/dL   Alkaline Phosphatase 69 39 - 117 IU/L   AST 32 0 - 40 IU/L   ALT 21 0 - 44 IU/L  Lipid Panel w/o Chol/HDL Ratio  Result Value Ref Range   Cholesterol, Total 159 100 - 199 mg/dL   Triglycerides 888 0 -  149 mg/dL   HDL 58 >91 mg/dL   VLDL Cholesterol Cal 29 5 - 40 mg/dL   LDL Calculated 72 0 - 99 mg/dL  TSH  Result Value Ref Range   TSH 1.440 0.450 - 4.500 uIU/mL  UA/M w/rflx Culture, Routine  Result Value Ref Range   Specific Gravity, UA 1.010 1.005 - 1.030   pH, UA 6.0 5.0 - 7.5   Color, UA Yellow Yellow   Appearance Ur Clear Clear   Leukocytes, UA Negative Negative   Protein, UA Negative Negative/Trace   Glucose, UA Negative Negative   Ketones, UA Negative Negative   RBC, UA Negative Negative   Bilirubin, UA Negative Negative   Urobilinogen, Ur 0.2 0.2 - 1.0 mg/dL   Nitrite, UA Negative Negative  Urine drugs of abuse scrn w alc, routine (Ref Lab)  Result Value Ref Range   Amphetamines, Urine Negative Cutoff=1000 ng/mL   Barbiturate Quant, Ur Negative Cutoff=300 ng/mL   Benzodiazepine Quant, Ur Negative Cutoff=300 ng/mL   Cannabinoid Quant, Ur Negative Cutoff=50 ng/mL   Cocaine (Metab.) Negative Cutoff=300 ng/mL   Opiate Quant, Ur Negative Cutoff=300 ng/mL   PCP Quant, Ur Negative Cutoff=25 ng/mL   Methadone Screen, Urine Negative Cutoff=300 ng/mL   Propoxyphene Negative Cutoff=300 ng/mL   Ethanol, Urine Negative Cutoff=0.020 %      Assessment & Plan:   Problem List Items Addressed This Visit      Other   Insomnia - Primary    Current regimen working for him better than all other tx's tried. Will continue until his sleep evaluation in March. Continue good sleep hygiene          Follow up plan: Return for as scheduled.

## 2018-10-07 NOTE — Assessment & Plan Note (Signed)
Current regimen working for him better than all other tx's tried. Will continue until his sleep evaluation in March. Continue good sleep hygiene

## 2018-10-08 ENCOUNTER — Other Ambulatory Visit: Payer: Self-pay | Admitting: Family Medicine

## 2018-10-10 NOTE — Telephone Encounter (Signed)
Patient called, left VM that Seroquel was discontinued by provider at last OV, so this medication refill request will be denied and if there are any questions, call the office back.

## 2018-10-30 ENCOUNTER — Other Ambulatory Visit: Payer: Self-pay | Admitting: Family Medicine

## 2018-11-16 ENCOUNTER — Ambulatory Visit (INDEPENDENT_AMBULATORY_CARE_PROVIDER_SITE_OTHER): Payer: No Typology Code available for payment source | Admitting: Neurology

## 2018-11-16 ENCOUNTER — Encounter: Payer: Self-pay | Admitting: Neurology

## 2018-11-16 VITALS — BP 112/67 | HR 43 | Ht 66.0 in | Wt 167.0 lb

## 2018-11-16 DIAGNOSIS — G479 Sleep disorder, unspecified: Secondary | ICD-10-CM | POA: Diagnosis not present

## 2018-11-16 DIAGNOSIS — G4700 Insomnia, unspecified: Secondary | ICD-10-CM

## 2018-11-16 NOTE — Patient Instructions (Signed)
Talk to Dillon Sosa about a referral to sleep psychology.  Your chronic insomnia will be best treated with cognitive behavioral therapy (CBT-I). I would be happy to see you back as needed.   We do not recommend daily use of prescription sleeping pills, especially in someone as young as you.   I would be happy to check with a sleep study for an underlying organic sleep disorder. Call us back when you are ready to proceed with sleep study testing.

## 2018-11-16 NOTE — Progress Notes (Deleted)
Subjective:    Patient ID: Dillon Sosa is a 23 y.o. male.  HPI {Common ambulatory SmartLinks:[id number]}  Review of Systems  Neurological:       Pt presents today to discuss his sleep. Pt has never had a sleep study and does not think he snores. Pt takes trazodone 200mg  daily and ambien PRN.  Epworth Sleepiness Scale 0= would never doze 1= slight chance of dozing 2= moderate chance of dozing 3= high chance of dozing  Sitting and reading: 0 Watching TV: 0 Sitting inactive in a public place (ex. Theater or meeting): 0 As a passenger in a car for an hour without a break: 0 Lying down to rest in the afternoon: 0 Sitting and talking to someone: 0 Sitting quietly after lunch (no alcohol): 0 In a car, while stopped in traffic: 0 Total: 0     Objective:  Neurological Exam  Physical Exam  Assessment:   ***  Plan:   ***

## 2018-11-16 NOTE — Progress Notes (Signed)
Subjective:    Patient ID: Dillon Sosa is a 23 y.o. male.  HPI     Huston Foley, MD, PhD [facility] [address] [address] [place], [place] [address]  Dear Fleet Contras,   I saw your patient, Dillon Sosa, upon your kind request in my sleep clinic today for initial consultation of his sleep disorder, in particular, difficulty with sleep initiation and sleep maintenance for over a 2  years. The patient is unaccompanied today. As you know, [name] is a 22 year old right-handed male with an underlying medical history of ADHD and borderline overweight state, who reports difficulty both with sleep initiation and maintenance for at least 2  years. He has tried over-the-counter medications including Benadryl, NyQuil, melatonin. None of these helped. He also reports a family history of insomnia. This is likely on his father's side. I reviewed your office note from 10/07/2018. He is currently on fairly high-dose trazodone 200 mg each night and takes in addition to when necessary Ambien 5 mg strength. He has been on Adderall long-acting 20 mg once daily for the past 4 months. He has been on trazodone for the past 6+ months, he has been on Ambien as needed for the past 2 months or so. He has a long-standing history of ADHD and has been on a stimulant off and on for most of his life. He does not snore. Epworth sleepiness score is 0 out of 24 today, fatigue score is 11 out of 63. He does not watch TV in his bedroom. Bedtime is around 9 or 10, rise time around 5. He works full-time as a Psychologist, occupational and also has 2 college classes. He reports that his current medication regimen works fairly well for him. He quit smoking about 6 months ago and does not utilize alcohol currently, drinks caffeine occasionally, maybe once a week. He lives with his brother, he is single, no children. They have no pets in the household currently. He denies night to night nocturia or recurrent  headaches. He has rumination of thought at night. He denies significant depression but does get easily stressed out he admits.  His Past Medical History Is Significant For: Past Medical History:  Diagnosis Date  . ADHD   . Insomnia     His Past Surgical History Is Significant For: Past Surgical History:  Procedure Laterality Date  . FOOT SURGERY Left 2016    His Family History Is Significant For: Family History  Problem Relation Age of Onset  . Breast cancer Mother   . Insomnia Father   . Heart attack Maternal Grandmother     His Social History Is Significant For: Social History   Socioeconomic History  . Marital status: Single    Spouse name: Not on file  . Number of children: Not on file  . Years of education: Not on file  . Highest education level: Not on file  Occupational History  . Not on file  Social Needs  . Financial resource strain: Not on file  . Food insecurity:    Worry: Not on file    Inability: Not on file  . Transportation needs:    Medical: Not on file    Non-medical: Not on file  Tobacco Use  . Smoking status: Former Smoker    Years: 2.00    Types: Cigarettes  . Smokeless tobacco: Former Neurosurgeon    Types: Chew    Quit date: 10/15/2017  Substance and Sexual Activity  . Alcohol use: Not  Currently  . Drug use: No  . Sexual activity: Yes  Lifestyle  . Physical activity:    Days per week: Not on file    Minutes per session: Not on file  . Stress: Not on file  Relationships  . Social connections:    Talks on phone: Not on file    Gets together: Not on file    Attends religious service: Not on file    Active member of club or organization: Not on file    Attends meetings of clubs or organizations: Not on file    Relationship status: Not on file  Other Topics Concern  . Not on file  Social History Narrative  . Not on file    His Allergies Are:  No Known Allergies:   His Current Medications Are:  Outpatient Encounter Medications as of  11/16/2018  Medication Sig  . amphetamine-dextroamphetamine (ADDERALL XR) 20 MG 24 hr capsule Take 1 capsule (20 mg total) by mouth every morning.  Marland Kitchen amphetamine-dextroamphetamine (ADDERALL XR) 20 MG 24 hr capsule Take 1 capsule (20 mg total) by mouth every morning.  Marland Kitchen amphetamine-dextroamphetamine (ADDERALL XR) 20 MG 24 hr capsule Take 1 capsule (20 mg total) by mouth every morning.  Marland Kitchen amphetamine-dextroamphetamine (ADDERALL XR) 20 MG 24 hr capsule Take 1 capsule (20 mg total) by mouth every morning.  Marland Kitchen amphetamine-dextroamphetamine (ADDERALL XR) 20 MG 24 hr capsule Take 1 capsule (20 mg total) by mouth every morning.  . traZODone (DESYREL) 100 MG tablet TAKE 2 TABLETS (200 MG TOTAL) BY MOUTH AT BEDTIME AS NEEDED FOR SLEEP.  Marland Kitchen zolpidem (AMBIEN) 5 MG tablet Take 1 tablet (5 mg total) by mouth at bedtime as needed for sleep.  Melene Muller ON 11/21/2018] zolpidem (AMBIEN) 5 MG tablet Take 1 tablet (5 mg total) by mouth at bedtime as needed for sleep.   No facility-administered encounter medications on file as of 11/16/2018.   :  Review of Systems:  Out of a complete 14 point review of systems, all are reviewed and negative with the exception of these symptoms as listed below:  Review of Systems  Neurological:       Pt presents today to discuss his sleep. Pt has never had a sleep study and does not think he snores. Pt takes trazodone 200mg  daily and ambien PRN.  Epworth Sleepiness Scale 0= would never doze 1= slight chance of dozing 2= moderate chance of dozing 3= high chance of dozing  Sitting and reading: 0 Watching TV: 0 Sitting inactive in a public place (ex. Theater or meeting): 0 As a passenger in a car for an hour without a break: 0 Lying down to rest in the afternoon: 0 Sitting and talking to someone: 0 Sitting quietly after lunch (no alcohol): 0 In a car, while stopped in traffic: 0 Total: 0     Objective:  Neurological Exam  Physical Exam Physical Examination:   Vitals:    11/16/18 1607  BP: 112/67  Pulse: (!) 43    General Examination: The patient is a very pleasant 23 y.o. male in no acute distress. He appears well-developed and well-nourished and adequately groomed.   HEENT: Normocephalic, atraumatic, pupils are equal, round and reactive to light and accommodation. Extraocular tracking is good without limitation to gaze excursion or nystagmus noted. Normal smooth pursuit is noted. Hearing is grossly intact. Face is symmetric with normal facial animation and normal facial sensation. Speech is clear with no dysarthria noted. There is no hypophonia. There is no lip,  neck/head, jaw or voice tremor. Neck is supple with full range of passive and active motion. There are no carotid bruits on auscultation. Oropharynx exam reveals: mild mouth dryness, adequate dental hygiene and mild airway crowding, due to elongated uvula and tonsils in place, about 1+ bilaterally. Neck circumference is 15-7/8 inches. Mallampati is class I. Tongue protrudes centrally and palate elevates symmetrically.  Chest: Clear to auscultation without wheezing, rhonchi or crackles noted.  Heart: S1+S2+0, regular and normal without murmurs, rubs or gallops noted, mildly bradycardic.   Abdomen: Soft, non-tender and non-distended.  Extremities: There is no obvious change.   Skin: Warm and dry without trophic changes noted.  Musculoskeletal: exam reveals no obvious joint deformities, tenderness or joint swelling or erythema.   Neurologically:  Mental status: The patient is awake, alert and oriented in all 4 spheres. His immediate and remote memory, attention, language skills and fund of knowledge are appropriate. There is no evidence of aphasia, agnosia, apraxia or anomia. Speech is clear with normal prosody and enunciation. Thought process is linear. Mood is normal and affect is normal.  Cranial nerves II - XII are as described above under HEENT exam. In addition: shoulder shrug is normal with equal  shoulder height noted. Motor exam: Normal bulk, strength and tone is noted. There is no drift, tremor or rebound. Romberg is negative. Fine motor skills and coordination: grossly intact.  Cerebellar testing: No dysmetria or intention tremor. There is no truncal or gait ataxia.  Sensory exam: intact to light touch.  Gait, station and balance: He stands easily. No veering to one side is noted. No leaning to one side is noted. Posture is age-appropriate and stance is narrow based. Gait shows normal stride length and normal pace. No problems turning are noted. Tandem walk is unremarkable.   Assessment and Plan:   In summary, Jaydyn Bozzo Leas is a very pleasant 23 y.o.-year old male with an underlying medical history of ADHD and borderline overweight state, who presents for sleep evaluation. He has a longstanding Hx of difficulty with sleep initiation and sleep maintenance. He does not give a telltale history for sleep apnea but I would certainly recommend that we proceed with a sleep study to rule out any other organic cause of his symptoms. He may benefit from cognitive behavioral therapy for chronic insomnia and he is encouraged to talk to about a sleep psychology referral. I do not recommend long-term use of prescription sleeping pills in any adult , especially someone as young as he is. He is not ready to proceed with sleep study testing quite yet, reports, that he is very busy between his job and his school. He is encouraged to call back for sleep study testing, when he is ready to pursue it. I talked to him about obstructive sleep apnea and treatment options for this. I also talked to him about the importance of Ongoing smoking cessation and maintaining a healthy lifestyle as well as good sleep hygiene. I plan to see him back after sleep testing. He is encouraged to call our office when he is ready. I answered all his questions today and he was in agreement. Thank you very much for allowing me to  participate in the care of this nice patient. If I can be of any further assistance to you please do not hesitate to call me at [phone].  Sincerely,   Huston Foley, MD, PhD

## 2018-12-02 ENCOUNTER — Encounter: Payer: Self-pay | Admitting: Family Medicine

## 2018-12-02 ENCOUNTER — Ambulatory Visit (INDEPENDENT_AMBULATORY_CARE_PROVIDER_SITE_OTHER): Payer: PRIVATE HEALTH INSURANCE | Admitting: Family Medicine

## 2018-12-02 ENCOUNTER — Other Ambulatory Visit: Payer: Self-pay

## 2018-12-02 VITALS — BP 116/75 | HR 62 | Temp 98.9°F

## 2018-12-02 DIAGNOSIS — G4700 Insomnia, unspecified: Secondary | ICD-10-CM

## 2018-12-02 DIAGNOSIS — F909 Attention-deficit hyperactivity disorder, unspecified type: Secondary | ICD-10-CM | POA: Diagnosis not present

## 2018-12-02 MED ORDER — ZOLPIDEM TARTRATE 5 MG PO TABS: 5.0000 mg | ORAL_TABLET | Freq: Every evening | ORAL | 0 refills | Status: DC | PRN

## 2018-12-02 MED ORDER — AMPHETAMINE-DEXTROAMPHET ER 20 MG PO CP24: 20.0000 mg | ORAL_CAPSULE | ORAL | 0 refills | Status: DC

## 2018-12-02 NOTE — Progress Notes (Signed)
BP 116/75   Pulse 62   Temp 98.9 F (37.2 C) (Oral)   SpO2 97%    Subjective:    Patient ID: Dillon Sosa, male    DOB: [birth year], 23 y.o.   MRN: [id number]  HPI: LEEVON MADAY is a 23 y.o. male  Chief Complaint  Patient presents with  . ADHD  . Insomnia   Here today for 3 month ADHD and insomnia f/u. Feels his adderall is still going well, helping significantly with focus and productivity and not having any side effects including CP, palpitations, appetite, worsened sleep issues (above baseline issues as discussed below).   Recently got evaluated by Sleep Specialist for his chronic insomnia. Awaiting word on if a sleep study will be covered for him to r/o sleep apnea but otherwise no obvious cause to his insomnia was found. Currently on high dose trazodone and prn ambien which does seem to help. Has tried counseling, sleep hygiene strategies, seroquel, and many OTC medications without relief in the past.   Relevant past medical, surgical, family and social history reviewed and updated as indicated. Interim medical history since our last visit reviewed. Allergies and medications reviewed and updated.  Review of Systems  Per HPI unless specifically indicated above     Objective:    BP 116/75   Pulse 62   Temp 98.9 F (37.2 C) (Oral)   SpO2 97%   Wt Readings from Last 3 Encounters:  11/16/18 167 lb (75.8 kg)  10/07/18 166 lb 1.6 oz (75.3 kg)  09/02/18 166 lb (75.3 kg)    Physical Exam Vitals signs and nursing note reviewed.  Constitutional:      Appearance: Normal appearance.  HENT:     Head: Atraumatic.  Eyes:     Extraocular Movements: Extraocular movements intact.     Conjunctiva/sclera: Conjunctivae normal.  Neck:     Musculoskeletal: Normal range of motion and neck supple.  Cardiovascular:     Rate and Rhythm: Normal rate and regular rhythm.  Pulmonary:     Effort: Pulmonary effort is normal.     Breath sounds: Normal breath sounds.   Musculoskeletal: Normal range of motion.  Skin:    General: Skin is warm and dry.  Neurological:     General: No focal deficit present.     Mental Status: He is oriented to person, place, and time.  Psychiatric:        Mood and Affect: Mood normal.        Thought Content: Thought content normal.        Judgment: Judgment normal.     Results for orders placed or performed in visit on 04/29/18  CBC with Differential/Platelet  Result Value Ref Range   WBC 7.1 3.4 - 10.8 x10E3/uL   RBC 4.61 4.14 - 5.80 x10E6/uL   Hemoglobin 14.0 13.0 - 17.7 g/dL   Hematocrit 42.7 06.2 - 51.0 %   MCV 89 79 - 97 fL   MCH 30.4 26.6 - 33.0 pg   MCHC 34.0 31.5 - 35.7 g/dL   RDW 37.6 28.3 - 15.1 %   Platelets 213 150 - 450 x10E3/uL   Neutrophils 57 Not Estab. %   Lymphs 35 Not Estab. %   Monocytes 8 Not Estab. %   Eos 0 Not Estab. %   Basos 0 Not Estab. %   Neutrophils Absolute 4.0 1.4 - 7.0 x10E3/uL   Lymphocytes Absolute 2.5 0.7 - 3.1 x10E3/uL   Monocytes Absolute 0.6 0.1 - 0.9  x10E3/uL   EOS (ABSOLUTE) 0.0 0.0 - 0.4 x10E3/uL   Basophils Absolute 0.0 0.0 - 0.2 x10E3/uL   Immature Granulocytes 0 Not Estab. %   Immature Grans (Abs) 0.0 0.0 - 0.1 x10E3/uL  Comprehensive metabolic panel  Result Value Ref Range   Glucose 64 (L) 65 - 99 mg/dL   BUN 21 (H) 6 - 20 mg/dL   Creatinine, Ser 8.65 0.76 - 1.27 mg/dL   GFR calc non Af Amer 112 >59 mL/min/1.73   GFR calc Af Amer 129 >59 mL/min/1.73   BUN/Creatinine Ratio 22 (H) 9 - 20   Sodium 140 134 - 144 mmol/L   Potassium 4.1 3.5 - 5.2 mmol/L   Chloride 102 96 - 106 mmol/L   CO2 22 20 - 29 mmol/L   Calcium 9.8 8.7 - 10.2 mg/dL   Total Protein 7.3 6.0 - 8.5 g/dL   Albumin 4.8 3.5 - 5.5 g/dL   Globulin, Total 2.5 1.5 - 4.5 g/dL   Albumin/Globulin Ratio 1.9 1.2 - 2.2   Bilirubin Total 0.2 0.0 - 1.2 mg/dL   Alkaline Phosphatase 69 39 - 117 IU/L   AST 32 0 - 40 IU/L   ALT 21 0 - 44 IU/L  Lipid Panel w/o Chol/HDL Ratio  Result Value Ref Range    Cholesterol, Total 159 100 - 199 mg/dL   Triglycerides 784 0 - 149 mg/dL   HDL 58 >69 mg/dL   VLDL Cholesterol Cal 29 5 - 40 mg/dL   LDL Calculated 72 0 - 99 mg/dL  TSH  Result Value Ref Range   TSH 1.440 0.450 - 4.500 uIU/mL  UA/M w/rflx Culture, Routine  Result Value Ref Range   Specific Gravity, UA 1.010 1.005 - 1.030   pH, UA 6.0 5.0 - 7.5   Color, UA Yellow Yellow   Appearance Ur Clear Clear   Leukocytes, UA Negative Negative   Protein, UA Negative Negative/Trace   Glucose, UA Negative Negative   Ketones, UA Negative Negative   RBC, UA Negative Negative   Bilirubin, UA Negative Negative   Urobilinogen, Ur 0.2 0.2 - 1.0 mg/dL   Nitrite, UA Negative Negative  Urine drugs of abuse scrn w alc, routine (Ref Lab)  Result Value Ref Range   Amphetamines, Urine Negative Cutoff=1000 ng/mL   Barbiturate Quant, Ur Negative Cutoff=300 ng/mL   Benzodiazepine Quant, Ur Negative Cutoff=300 ng/mL   Cannabinoid Quant, Ur Negative Cutoff=50 ng/mL   Cocaine (Metab.) Negative Cutoff=300 ng/mL   Opiate Quant, Ur Negative Cutoff=300 ng/mL   PCP Quant, Ur Negative Cutoff=25 ng/mL   Methadone Screen, Urine Negative Cutoff=300 ng/mL   Propoxyphene Negative Cutoff=300 ng/mL   Ethanol, Urine Negative Cutoff=0.020 %      Assessment & Plan:   Problem List Items Addressed This Visit      Other   ADHD    Stable and under good control, continue adderall 20 mg XR daily as needed      Insomnia - Primary    Will work on cutting way back if not tapering off the [place], 1 script should last 3 months. Continue the trazodone at 200 mg nightly as needed and continue counseling and good sleep hygiene. Await approval for sleep study and f/u with sleep medicine specialist for sleep apnea r/o          Follow up plan: Return in about 3 months (around 03/04/2019) for ADHD, sleep f/u.

## 2018-12-09 NOTE — Assessment & Plan Note (Signed)
Stable and under good control, continue adderall 20 mg XR daily as needed

## 2018-12-09 NOTE — Assessment & Plan Note (Signed)
Will work on cutting way back if not tapering off the [place], 1 script should last 3 months. Continue the trazodone at 200 mg nightly as needed and continue counseling and good sleep hygiene. Await approval for sleep study and f/u with sleep medicine specialist for sleep apnea r/o

## 2019-03-10 ENCOUNTER — Encounter: Payer: Self-pay | Admitting: Family Medicine

## 2019-03-10 ENCOUNTER — Ambulatory Visit: Payer: PRIVATE HEALTH INSURANCE | Admitting: Family Medicine

## 2019-03-10 ENCOUNTER — Other Ambulatory Visit: Payer: Self-pay

## 2019-03-10 VITALS — BP 124/70 | HR 61 | Temp 98.5°F | Ht 66.0 in | Wt 163.0 lb

## 2019-03-10 DIAGNOSIS — R229 Localized swelling, mass and lump, unspecified: Secondary | ICD-10-CM

## 2019-03-10 DIAGNOSIS — G4700 Insomnia, unspecified: Secondary | ICD-10-CM | POA: Diagnosis not present

## 2019-03-10 DIAGNOSIS — F909 Attention-deficit hyperactivity disorder, unspecified type: Secondary | ICD-10-CM | POA: Diagnosis not present

## 2019-03-10 DIAGNOSIS — IMO0002 Reserved for concepts with insufficient information to code with codable children: Secondary | ICD-10-CM

## 2019-03-10 MED ORDER — AMPHETAMINE-DEXTROAMPHET ER 20 MG PO CP24: 20.0000 mg | ORAL_CAPSULE | ORAL | 0 refills | Status: DC

## 2019-03-10 MED ORDER — AMPHETAMINE-DEXTROAMPHET ER 20 MG PO CP24: 20.0000 mg | ORAL_CAPSULE | Freq: Every day | ORAL | 0 refills | Status: DC

## 2019-03-10 MED ORDER — TRAZODONE HCL 150 MG PO TABS: 150.0000 mg | ORAL_TABLET | Freq: Every day | ORAL | 0 refills | Status: DC

## 2019-03-10 MED ORDER — ZOLPIDEM TARTRATE 5 MG PO TABS: 5.0000 mg | ORAL_TABLET | Freq: Every evening | ORAL | 0 refills | Status: DC | PRN

## 2019-03-10 NOTE — Progress Notes (Signed)
BP 124/70   Pulse 61   Temp 98.5 F (36.9 C) (Oral)   Ht 5\' 6"  (1.676 m)   Wt 163 lb (73.9 kg)   SpO2 97%   BMI 26.31 kg/m    Subjective:    Patient ID: Dillon Sosa, male    DOB: 02-05-1996, 23 y.o.   MRN: [id number]  HPI: Dillon Sosa is a 23 y.o. male  Chief Complaint  Patient presents with  . ADHD    691m f/u  . Insomnia   Here today for sleep and ADHD f/u. Currently on 150 mg trazodone nightly, decreased from 200 mg. Taking ambien prn with a goal of 30 tabs lasting 3 months. States this regimen is going well for him overall. Denies side effects to the medications. Practices good sleep hygiene.   Adderall 20 mg XR continues to do well for him. Feels very focused, completes tasks efficiently. Denies CP, palpitations, HAs, appetite issues.   Having a bulge between the top two abdominal muscles that he's noticed over the past few months. When standing, sometimes will have to push the bulge back inward. Does not hurt very much but he notices it and would like to figure out what's going on. Works out regularly. No known injury, skin discoloration, abdominal pain, bowel changes.   Relevant past medical, surgical, family and social history reviewed and updated as indicated. Interim medical history since our last visit reviewed. Allergies and medications reviewed and updated.  Review of Systems  Per HPI unless specifically indicated above     Objective:    BP 124/70   Pulse 61   Temp 98.5 F (36.9 C) (Oral)   Ht 5\' 6"  (1.676 m)   Wt 163 lb (73.9 kg)   SpO2 97%   BMI 26.31 kg/m   Wt Readings from Last 3 Encounters:  03/10/19 163 lb (73.9 kg)  11/16/18 167 lb (75.8 kg)  10/07/18 166 lb 1.6 oz (75.3 kg)    Physical Exam Vitals signs and nursing note reviewed.  Constitutional:      Appearance: Normal appearance.  HENT:     Head: Atraumatic.  Eyes:     Extraocular Movements: Extraocular movements intact.     Conjunctiva/sclera: Conjunctivae normal.   Neck:     Musculoskeletal: Normal range of motion and neck supple.  Cardiovascular:     Rate and Rhythm: Normal rate and regular rhythm.  Pulmonary:     Effort: Pulmonary effort is normal.     Breath sounds: Normal breath sounds.  Abdominal:     General: Bowel sounds are normal.     Palpations: Abdomen is soft. There is mass (superficial bulge palpable between upper left rectus abdominus muscles, reducible).     Tenderness: There is no abdominal tenderness. There is no guarding.  Musculoskeletal: Normal range of motion.  Skin:    General: Skin is warm and dry.  Neurological:     General: No focal deficit present.     Mental Status: He is oriented to person, place, and time.  Psychiatric:        Mood and Affect: Mood normal.        Thought Content: Thought content normal.        Judgment: Judgment normal.     Results for orders placed or performed in visit on 04/29/18  CBC with Differential/Platelet  Result Value Ref Range   WBC 7.1 3.4 - 10.8 x10E3/uL   RBC 4.61 4.14 - 5.80 x10E6/uL   Hemoglobin 14.0  13.0 - 17.7 g/dL   Hematocrit 41.2 37.5 - 51.0 %   MCV 89 79 - 97 fL   MCH 30.4 26.6 - 33.0 pg   MCHC 34.0 31.5 - 35.7 g/dL   RDW 12.8 12.3 - 15.4 %   Platelets 213 150 - 450 x10E3/uL   Neutrophils 57 Not Estab. %   Lymphs 35 Not Estab. %   Monocytes 8 Not Estab. %   Eos 0 Not Estab. %   Basos 0 Not Estab. %   Neutrophils Absolute 4.0 1.4 - 7.0 x10E3/uL   Lymphocytes Absolute 2.5 0.7 - 3.1 x10E3/uL   Monocytes Absolute 0.6 0.1 - 0.9 x10E3/uL   EOS (ABSOLUTE) 0.0 0.0 - 0.4 x10E3/uL   Basophils Absolute 0.0 0.0 - 0.2 x10E3/uL   Immature Granulocytes 0 Not Estab. %   Immature Grans (Abs) 0.0 0.0 - 0.1 x10E3/uL  Comprehensive metabolic panel  Result Value Ref Range   Glucose 64 (L) 65 - 99 mg/dL   BUN 21 (H) 6 - 20 mg/dL   Creatinine, Ser 0.96 0.76 - 1.27 mg/dL   GFR calc non Af Amer 112 >59 mL/min/1.73   GFR calc Af Amer 129 >59 mL/min/1.73   BUN/Creatinine Ratio 22  (H) 9 - 20   Sodium 140 134 - 144 mmol/L   Potassium 4.1 3.5 - 5.2 mmol/L   Chloride 102 96 - 106 mmol/L   CO2 22 20 - 29 mmol/L   Calcium 9.8 8.7 - 10.2 mg/dL   Total Protein 7.3 6.0 - 8.5 g/dL   Albumin 4.8 3.5 - 5.5 g/dL   Globulin, Total 2.5 1.5 - 4.5 g/dL   Albumin/Globulin Ratio 1.9 1.2 - 2.2   Bilirubin Total 0.2 0.0 - 1.2 mg/dL   Alkaline Phosphatase 69 39 - 117 IU/L   AST 32 0 - 40 IU/L   ALT 21 0 - 44 IU/L  Lipid Panel w/o Chol/HDL Ratio  Result Value Ref Range   Cholesterol, Total 159 100 - 199 mg/dL   Triglycerides 144 0 - 149 mg/dL   HDL 58 >39 mg/dL   VLDL Cholesterol Cal 29 5 - 40 mg/dL   LDL Calculated 72 0 - 99 mg/dL  TSH  Result Value Ref Range   TSH 1.440 0.450 - 4.500 uIU/mL  UA/M w/rflx Culture, Routine   Specimen: Urine   URINE  Result Value Ref Range   Specific Gravity, UA 1.010 1.005 - 1.030   pH, UA 6.0 5.0 - 7.5   Color, UA Yellow Yellow   Appearance Ur Clear Clear   Leukocytes, UA Negative Negative   Protein, UA Negative Negative/Trace   Glucose, UA Negative Negative   Ketones, UA Negative Negative   RBC, UA Negative Negative   Bilirubin, UA Negative Negative   Urobilinogen, Ur 0.2 0.2 - 1.0 mg/dL   Nitrite, UA Negative Negative  Urine drugs of abuse scrn w alc, routine (Ref Lab)  Result Value Ref Range   Amphetamines, Urine Negative Cutoff=1000 ng/mL   Barbiturate Quant, Ur Negative Cutoff=300 ng/mL   Benzodiazepine Quant, Ur Negative Cutoff=300 ng/mL   Cannabinoid Quant, Ur Negative Cutoff=50 ng/mL   Cocaine (Metab.) Negative Cutoff=300 ng/mL   Opiate Quant, Ur Negative Cutoff=300 ng/mL   PCP Quant, Ur Negative Cutoff=25 ng/mL   Methadone Screen, Urine Negative Cutoff=300 ng/mL   Propoxyphene Negative Cutoff=300 ng/mL   Ethanol, Urine Negative Cutoff=0.020 %      Assessment & Plan:   Problem List Items Addressed This Visit  Other   ADHD - Primary    Stable and under good control with adderall 20 mg XR. Continue current  regimen      Insomnia    Sleeping well with 150 mg trazodone and prn ambien. Continue this regimen with goal of 1 ambien script lasting 3 months       Other Visit Diagnoses    Mass       Will obtain ultrasound to determine lipoma vs muscle strain vs diastasis vs hernia   Relevant Orders   US Abdomen Limited       Follow up plan: Return in about 3 months (around 06/10/2019) for ADHD, insomnia f/u.

## 2019-03-14 NOTE — Assessment & Plan Note (Signed)
Sleeping well with 150 mg trazodone and prn ambien. Continue this regimen with goal of 1 ambien script lasting 3 months

## 2019-03-14 NOTE — Assessment & Plan Note (Signed)
Stable and under good control with adderall 20 mg XR. Continue current regimen

## 2019-03-22 ENCOUNTER — Ambulatory Visit: Payer: No Typology Code available for payment source

## 2019-03-22 ENCOUNTER — Encounter: Payer: Self-pay | Admitting: Family Medicine

## 2019-04-25 ENCOUNTER — Encounter: Payer: Self-pay | Admitting: Family Medicine

## 2019-05-05 ENCOUNTER — Other Ambulatory Visit: Payer: Self-pay

## 2019-05-05 ENCOUNTER — Ambulatory Visit (INDEPENDENT_AMBULATORY_CARE_PROVIDER_SITE_OTHER): Payer: PRIVATE HEALTH INSURANCE | Admitting: Nurse Practitioner

## 2019-05-05 ENCOUNTER — Encounter: Payer: Self-pay | Admitting: Nurse Practitioner

## 2019-05-05 VITALS — BP 129/74 | HR 48 | Temp 98.5°F

## 2019-05-05 DIAGNOSIS — Z113 Encounter for screening for infections with a predominantly sexual mode of transmission: Secondary | ICD-10-CM | POA: Diagnosis not present

## 2019-05-05 NOTE — Patient Instructions (Addendum)

## 2019-05-05 NOTE — Assessment & Plan Note (Signed)
Ordered RPR, HSV, HIV, GC/Chlam.  Recommend practicing safe sexual intercourse for prevention.

## 2019-05-05 NOTE — Progress Notes (Signed)
BP 129/74   Pulse (!) 48   Temp 98.5 F (36.9 C) (Oral)   SpO2 98%    Subjective:    Patient ID: Dillon Sosa, male    DOB: 08-25-1996, 23 y.o.   MRN: [id number]  HPI: Dillon Sosa is a 23 y.o. male  Chief Complaint  Patient presents with  . Labs Only    pt requesting STD screening, no symptoms    STD TESTING: Currently sexually active, no protection. In a monogamous relationship at this time, but does endorse having other partners over past year.  Would like to have testing as it has been awhile.  Denies any current symptoms or concerns, would just like testing which he reports he tries to get annually.  Last had in 2019, all normal.  Educated and discussed safe sexual practice with him as prevention.  Relevant past medical, surgical, family and social history reviewed and updated as indicated. Interim medical history since our last visit reviewed. Allergies and medications reviewed and updated.  Review of Systems  Constitutional: Negative for activity change, diaphoresis, fatigue and fever.  Respiratory: Negative for cough, chest tightness, shortness of breath and wheezing.   Cardiovascular: Negative for chest pain, palpitations and leg swelling.  Gastrointestinal: Negative for abdominal distention, abdominal pain, constipation, diarrhea, nausea and vomiting.  Genitourinary: Negative.   Psychiatric/Behavioral: Negative.     Per HPI unless specifically indicated above     Objective:    BP 129/74   Pulse (!) 48   Temp 98.5 F (36.9 C) (Oral)   SpO2 98%   Wt Readings from Last 3 Encounters:  03/10/19 163 lb (73.9 kg)  11/16/18 167 lb (75.8 kg)  10/07/18 166 lb 1.6 oz (75.3 kg)    Physical Exam Vitals signs and nursing note reviewed.  Constitutional:      General: He is awake. He is not in acute distress.    Appearance: He is well-developed. He is not ill-appearing.  HENT:     Head: Normocephalic and atraumatic.     Right Ear: Hearing normal. No  drainage.     Left Ear: Hearing normal. No drainage.  Eyes:     General: Lids are normal.        Right eye: No discharge.        Left eye: No discharge.     Conjunctiva/sclera: Conjunctivae normal.     Pupils: Pupils are equal, round, and reactive to light.  Neck:     Musculoskeletal: Normal range of motion and neck supple.  Cardiovascular:     Rate and Rhythm: Normal rate and regular rhythm.     Heart sounds: Normal heart sounds, S1 normal and S2 normal. No murmur. No gallop.   Pulmonary:     Effort: Pulmonary effort is normal. No accessory muscle usage or respiratory distress.     Breath sounds: Normal breath sounds.  Abdominal:     General: Bowel sounds are normal.     Palpations: Abdomen is soft.     Tenderness: There is no abdominal tenderness.  Musculoskeletal: Normal range of motion.     Right lower leg: No edema.     Left lower leg: No edema.  Skin:    General: Skin is warm and dry.  Neurological:     Mental Status: He is alert and oriented to person, place, and time.  Psychiatric:        Mood and Affect: Mood normal.        Behavior: Behavior normal.  Behavior is cooperative.        Thought Content: Thought content normal.        Judgment: Judgment normal.     Results for orders placed or performed in visit on 04/29/18  CBC with Differential/Platelet  Result Value Ref Range   WBC 7.1 3.4 - 10.8 x10E3/uL   RBC 4.61 4.14 - 5.80 x10E6/uL   Hemoglobin 14.0 13.0 - 17.7 g/dL   Hematocrit 96.041.2 45.437.5 - 51.0 %   MCV 89 79 - 97 fL   MCH 30.4 26.6 - 33.0 pg   MCHC 34.0 31.5 - 35.7 g/dL   RDW 09.812.8 11.912.3 - 14.715.4 %   Platelets 213 150 - 450 x10E3/uL   Neutrophils 57 Not Estab. %   Lymphs 35 Not Estab. %   Monocytes 8 Not Estab. %   Eos 0 Not Estab. %   Basos 0 Not Estab. %   Neutrophils Absolute 4.0 1.4 - 7.0 x10E3/uL   Lymphocytes Absolute 2.5 0.7 - 3.1 x10E3/uL   Monocytes Absolute 0.6 0.1 - 0.9 x10E3/uL   EOS (ABSOLUTE) 0.0 0.0 - 0.4 x10E3/uL   Basophils Absolute 0.0  0.0 - 0.2 x10E3/uL   Immature Granulocytes 0 Not Estab. %   Immature Grans (Abs) 0.0 0.0 - 0.1 x10E3/uL  Comprehensive metabolic panel  Result Value Ref Range   Glucose 64 (L) 65 - 99 mg/dL   BUN 21 (H) 6 - 20 mg/dL   Creatinine, Ser 8.290.96 0.76 - 1.27 mg/dL   GFR calc non Af Amer 112 >59 mL/min/1.73   GFR calc Af Amer 129 >59 mL/min/1.73   BUN/Creatinine Ratio 22 (H) 9 - 20   Sodium 140 134 - 144 mmol/L   Potassium 4.1 3.5 - 5.2 mmol/L   Chloride 102 96 - 106 mmol/L   CO2 22 20 - 29 mmol/L   Calcium 9.8 8.7 - 10.2 mg/dL   Total Protein 7.3 6.0 - 8.5 g/dL   Albumin 4.8 3.5 - 5.5 g/dL   Globulin, Total 2.5 1.5 - 4.5 g/dL   Albumin/Globulin Ratio 1.9 1.2 - 2.2   Bilirubin Total 0.2 0.0 - 1.2 mg/dL   Alkaline Phosphatase 69 39 - 117 IU/L   AST 32 0 - 40 IU/L   ALT 21 0 - 44 IU/L  Lipid Panel w/o Chol/HDL Ratio  Result Value Ref Range   Cholesterol, Total 159 100 - 199 mg/dL   Triglycerides 562144 0 - 149 mg/dL   HDL 58 >13>39 mg/dL   VLDL Cholesterol Cal 29 5 - 40 mg/dL   LDL Calculated 72 0 - 99 mg/dL  TSH  Result Value Ref Range   TSH 1.440 0.450 - 4.500 uIU/mL  UA/M w/rflx Culture, Routine   Specimen: Urine   URINE  Result Value Ref Range   Specific Gravity, UA 1.010 1.005 - 1.030   pH, UA 6.0 5.0 - 7.5   Color, UA Yellow Yellow   Appearance Ur Clear Clear   Leukocytes, UA Negative Negative   Protein, UA Negative Negative/Trace   Glucose, UA Negative Negative   Ketones, UA Negative Negative   RBC, UA Negative Negative   Bilirubin, UA Negative Negative   Urobilinogen, Ur 0.2 0.2 - 1.0 mg/dL   Nitrite, UA Negative Negative  Urine drugs of abuse scrn w alc, routine (Ref Lab)  Result Value Ref Range   Amphetamines, Urine Negative Cutoff=1000 ng/mL   Barbiturate Quant, Ur Negative Cutoff=300 ng/mL   Benzodiazepine Quant, Ur Negative Cutoff=300 ng/mL   Cannabinoid Quant, Ur  Negative Cutoff=50 ng/mL   Cocaine (Metab.) Negative Cutoff=300 ng/mL   Opiate Quant, Ur Negative  Cutoff=300 ng/mL   PCP Quant, Ur Negative Cutoff=25 ng/mL   Methadone Screen, Urine Negative Cutoff=300 ng/mL   Propoxyphene Negative Cutoff=300 ng/mL   Ethanol, Urine Negative Cutoff=0.020 %      Assessment & Plan:   Problem List Items Addressed This Visit      Other   Screen for STD (sexually transmitted disease) - Primary    Ordered RPR, HSV, HIV, GC/Chlam.  Recommend practicing safe sexual intercourse for prevention.      Relevant Orders   HIV Antibody (routine testing w rflx)   HSV(herpes simplex vrs) 1+2 ab-IgG   RPR   GC/Chlamydia Probe Amp       Follow up plan: Return if symptoms worsen or fail to improve.

## 2019-05-06 LAB — HSV(HERPES SIMPLEX VRS) I + II AB-IGG
HSV 1 Glycoprotein G Ab, IgG: <0.91 index (ref 0.00–0.90)
HSV 2 IgG, Type Spec: <0.91 index (ref 0.00–0.90)

## 2019-05-06 LAB — HIV ANTIBODY (ROUTINE TESTING W REFLEX): HIV Screen 4th Generation wRfx: NONREACTIVE

## 2019-05-06 LAB — RPR: RPR Ser Ql: NONREACTIVE

## 2019-05-09 LAB — GC/CHLAMYDIA PROBE AMP
Chlamydia trachomatis, NAA: NEGATIVE
Neisseria Gonorrhoeae by PCR: NEGATIVE

## 2019-05-31 ENCOUNTER — Encounter: Payer: Self-pay | Admitting: Family Medicine

## 2019-06-09 ENCOUNTER — Encounter: Payer: Self-pay | Admitting: Family Medicine

## 2019-06-09 ENCOUNTER — Other Ambulatory Visit: Payer: Self-pay

## 2019-06-09 ENCOUNTER — Ambulatory Visit (INDEPENDENT_AMBULATORY_CARE_PROVIDER_SITE_OTHER): Payer: PRIVATE HEALTH INSURANCE | Admitting: Family Medicine

## 2019-06-09 VITALS — Ht 66.0 in | Wt 163.0 lb

## 2019-06-09 DIAGNOSIS — G4700 Insomnia, unspecified: Secondary | ICD-10-CM

## 2019-06-09 DIAGNOSIS — F909 Attention-deficit hyperactivity disorder, unspecified type: Secondary | ICD-10-CM

## 2019-06-09 MED ORDER — TRAZODONE HCL 150 MG PO TABS: 150.0000 mg | ORAL_TABLET | Freq: Every day | ORAL | 0 refills | Status: DC

## 2019-06-09 MED ORDER — AMPHETAMINE-DEXTROAMPHET ER 20 MG PO CP24: 20.0000 mg | ORAL_CAPSULE | ORAL | 0 refills | Status: DC

## 2019-06-09 MED ORDER — ZOLPIDEM TARTRATE 5 MG PO TABS: 5.0000 mg | ORAL_TABLET | Freq: Every evening | ORAL | 0 refills | Status: DC | PRN

## 2019-06-09 MED ORDER — AMPHETAMINE-DEXTROAMPHET ER 20 MG PO CP24: 20.0000 mg | ORAL_CAPSULE | Freq: Every day | ORAL | 0 refills | Status: DC

## 2019-06-09 NOTE — Progress Notes (Signed)
Ht 5\' 6"  (1.676 m)   Wt 163 lb (73.9 kg)   BMI 26.31 kg/m    Subjective:    Patient ID: Dillon Sosa, male    DOB: 05-Jul-1996, 23 y.o.   MRN: [id number]  HPI: Dillon Sosa is a 23 y.o. male  Chief Complaint  Patient presents with  . ADHD    . This visit was completed via WebEx due to the restrictions of the COVID-19 pandemic. All issues as above were discussed and addressed. Physical exam was done as above through visual confirmation on WebEx. If it was felt that the patient should be evaluated in the office, they were directed there. The patient verbally consented to this visit. . Location of the patient: home . Location of the provider: work . Those involved with this call:  . Provider: Merrie Roof, PA-C . CMA: Merilyn Baba, [place] . Front Desk/Registration: Jill Side  . Time spent on call: 15 minutes with patient face to face via video conference. More than 50% of this time was spent in counseling and coordination of care. 5 minutes total spent in review of patient's record and preparation of their chart. I verified patient identity using two factors (patient name and date of birth). Patient consents verbally to being seen via telemedicine visit today.   Presenting today for 3 month ADHD and sleep f/u. Doing very well on his current regimen. No new concerns. Sleep well with trazodone nightly with occasional nights taking ambien instead if he's having a particularly hard time falling asleep. Taking adderall daily as needed with excellent benefit to his focus. Denies side effects, CP, SOB, palpitations, grogginess.   Relevant past medical, surgical, family and social history reviewed and updated as indicated. Interim medical history since our last visit reviewed. Allergies and medications reviewed and updated.  Review of Systems  Per HPI unless specifically indicated above     Objective:    Ht 5\' 6"  (1.676 m)   Wt 163 lb (73.9 kg)   BMI 26.31 kg/m   Wt  Readings from Last 3 Encounters:  06/09/19 163 lb (73.9 kg)  03/10/19 163 lb (73.9 kg)  11/16/18 167 lb (75.8 kg)    Physical Exam Vitals signs and nursing note reviewed.  Constitutional:      General: He is not in acute distress.    Appearance: Normal appearance.  HENT:     Head: Atraumatic.     Right Ear: External ear normal.     Left Ear: External ear normal.     Nose: Nose normal. No congestion.     Mouth/Throat:     Mouth: Mucous membranes are moist.     Pharynx: Oropharynx is clear.  Eyes:     Extraocular Movements: Extraocular movements intact.     Conjunctiva/sclera: Conjunctivae normal.  Neck:     Musculoskeletal: Normal range of motion.  Pulmonary:     Effort: Pulmonary effort is normal. No respiratory distress.  Musculoskeletal: Normal range of motion.  Skin:    General: Skin is dry.     Findings: No erythema or rash.  Neurological:     Mental Status: He is oriented to person, place, and time.  Psychiatric:        Mood and Affect: Mood normal.        Thought Content: Thought content normal.        Judgment: Judgment normal.     Results for orders placed or performed in visit on 05/05/19  GC/Chlamydia Probe Amp  Specimen: Urine   UR  Result Value Ref Range   Chlamydia trachomatis, NAA Negative Negative   Neisseria Gonorrhoeae by PCR Negative Negative  HIV Antibody (routine testing w rflx)  Result Value Ref Range   HIV Screen 4th Generation wRfx Non Reactive Non Reactive  HSV(herpes simplex vrs) 1+2 ab-IgG  Result Value Ref Range   HSV 1 Glycoprotein G Ab, IgG <0.91 0.00 - 0.90 index   HSV 2 IgG, Type Spec <0.91 0.00 - 0.90 index  RPR  Result Value Ref Range   RPR Ser Ql Non Reactive Non Reactive      Assessment & Plan:   Problem List Items Addressed This Visit      Other   ADHD - Primary    Stable and under good control, continue current regimen      Insomnia    Stable and under good control, continue current regimen          Follow  up plan: Return in about 3 months (around 09/08/2019) for ADHD, sleep f/u.

## 2019-06-12 NOTE — Assessment & Plan Note (Signed)
Stable and under good control, continue current regimen 

## 2019-09-02 ENCOUNTER — Encounter: Payer: Self-pay | Admitting: Family Medicine

## 2019-09-06 ENCOUNTER — Other Ambulatory Visit: Payer: Self-pay

## 2019-09-06 ENCOUNTER — Encounter: Payer: Self-pay | Admitting: Family Medicine

## 2019-09-06 ENCOUNTER — Ambulatory Visit (INDEPENDENT_AMBULATORY_CARE_PROVIDER_SITE_OTHER): Payer: PRIVATE HEALTH INSURANCE | Admitting: Family Medicine

## 2019-09-06 VITALS — Ht 66.0 in | Wt 165.0 lb

## 2019-09-06 DIAGNOSIS — F909 Attention-deficit hyperactivity disorder, unspecified type: Secondary | ICD-10-CM

## 2019-09-06 DIAGNOSIS — G4700 Insomnia, unspecified: Secondary | ICD-10-CM | POA: Diagnosis not present

## 2019-09-06 NOTE — Progress Notes (Signed)
Ht 5\' 6"  (1.676 m)   Wt 165 lb (74.8 kg)   BMI 26.63 kg/m    Subjective:    Patient ID: Dillon Sosa, male    DOB: Sep 15, 1995, 23 y.o.   MRN: [id number]  HPI: Dillon Sosa is a 23 y.o. male  Chief Complaint  Patient presents with  . ADHD  . Insomnia    . This visit was completed via WebEx due to the restrictions of the COVID-19 pandemic. All issues as above were discussed and addressed. Physical exam was done as above through visual confirmation on WebEx. If it was felt that the patient should be evaluated in the office, they were directed there. The patient verbally consented to this visit. . Location of the patient: work . Location of the provider: work . Those involved with this call:  . Provider: Merrie Roof, PA-C . CMA: Lesle Chris, [place] . Front Desk/Registration: Jill Side  . Time spent on call: 15 minutes with patient face to face via video conference. More than 50% of this time was spent in counseling and coordination of care. 5 minutes total spent in review of patient's record and preparation of their chart. I verified patient identity using two factors (patient name and date of birth). Patient consents verbally to being seen via telemedicine visit today.   Patient here today for 3 month ADHD and sleep f/u. Tolerating his medications well without side effects and noting significant benefit from them. Continues to do well with adderall regimen at 20 mg XR. No palpitations, CP, SOB, sleep or appetite changes with it. Taking trazodone and occasional ambien for insomnia that has been chronic and notes this helps tremendously. No new concerns today.   Relevant past medical, surgical, family and social history reviewed and updated as indicated. Interim medical history since our last visit reviewed. Allergies and medications reviewed and updated.  Review of Systems  Per HPI unless specifically indicated above     Objective:    Ht 5\' 6"  (1.676 m)   Wt  165 lb (74.8 kg)   BMI 26.63 kg/m   Wt Readings from Last 3 Encounters:  09/06/19 165 lb (74.8 kg)  06/09/19 163 lb (73.9 kg)  03/10/19 163 lb (73.9 kg)    Physical Exam Vitals and nursing note reviewed.  Constitutional:      General: He is not in acute distress.    Appearance: Normal appearance.  HENT:     Head: Atraumatic.     Right Ear: External ear normal.     Left Ear: External ear normal.     Nose: Nose normal. No congestion.     Mouth/Throat:     Mouth: Mucous membranes are moist.     Pharynx: Oropharynx is clear.  Eyes:     Extraocular Movements: Extraocular movements intact.     Conjunctiva/sclera: Conjunctivae normal.  Cardiovascular:     Rate and Rhythm: Normal rate and regular rhythm.  Pulmonary:     Effort: Pulmonary effort is normal. No respiratory distress.  Musculoskeletal:        General: Normal range of motion.     Cervical back: Normal range of motion.  Skin:    General: Skin is dry.     Findings: No erythema or rash.  Neurological:     Mental Status: He is oriented to person, place, and time.  Psychiatric:        Mood and Affect: Mood normal.        Thought Content: Thought  content normal.        Judgment: Judgment normal.     Results for orders placed or performed in visit on 05/05/19  GC/Chlamydia Probe Amp   Specimen: Urine   UR  Result Value Ref Range   Chlamydia trachomatis, NAA Negative Negative   Neisseria Gonorrhoeae by PCR Negative Negative  HIV Antibody (routine testing w rflx)  Result Value Ref Range   HIV Screen 4th Generation wRfx Non Reactive Non Reactive  HSV(herpes simplex vrs) 1+2 ab-IgG  Result Value Ref Range   HSV 1 Glycoprotein G Ab, IgG <0.91 0.00 - 0.90 index   HSV 2 IgG, Type Spec <0.91 0.00 - 0.90 index  RPR  Result Value Ref Range   RPR Ser Ql Non Reactive Non Reactive      Assessment & Plan:   Problem List Items Addressed This Visit      Other   ADHD - Primary    Stable and under good control, continue  current regimen      Insomnia    Stable, well controlled. Continue current regimen          Follow up plan: Return in about 3 months (around 12/05/2019) for ADHD, sleep f/u.

## 2019-09-07 MED ORDER — AMPHETAMINE-DEXTROAMPHET ER 20 MG PO CP24: 20.0000 mg | ORAL_CAPSULE | ORAL | 0 refills | Status: DC

## 2019-09-07 MED ORDER — TRAZODONE HCL 150 MG PO TABS: 150.0000 mg | ORAL_TABLET | Freq: Every day | ORAL | 1 refills | Status: DC

## 2019-09-07 MED ORDER — ZOLPIDEM TARTRATE 5 MG PO TABS: 5.0000 mg | ORAL_TABLET | Freq: Every evening | ORAL | 0 refills | Status: DC | PRN

## 2019-09-11 NOTE — Assessment & Plan Note (Signed)
Stable, well controlled. Continue current regimen 

## 2019-09-11 NOTE — Assessment & Plan Note (Signed)
Stable and under good control, continue current regimen 

## 2019-11-03 ENCOUNTER — Encounter: Payer: Self-pay | Admitting: Family Medicine

## 2019-11-30 ENCOUNTER — Telehealth: Payer: Self-pay | Admitting: Family Medicine

## 2019-11-30 ENCOUNTER — Encounter: Payer: Self-pay | Admitting: Family Medicine

## 2019-11-30 MED ORDER — AMPHETAMINE-DEXTROAMPHET ER 20 MG PO CP24: 20.0000 mg | ORAL_CAPSULE | ORAL | 0 refills | Status: DC

## 2019-11-30 MED ORDER — ZOLPIDEM TARTRATE 5 MG PO TABS: 5.0000 mg | ORAL_TABLET | Freq: Every evening | ORAL | 0 refills | Status: DC | PRN

## 2019-11-30 NOTE — Telephone Encounter (Signed)
I returned this pt's call from the attached message to find out what medications he was referring to being sent in. He complained repeatedly about the automated system cancelling his appt.   He has a new appt on 12/07/2019.   I let him know I do not know what happened with the automated system cancelling his appt and apologized for it happening.  I asked him what he was referring to pertaining to his medications.   He replied,  "That's what the appt is for so they can be refilled".    I said, "ok", Fleet Contras will be doing that for you on 12/07/2019.     He replied,   "I had to have an appt first so she could go over my medications".   "I just don't understand why the automated machine cancelled my appt".   He then hung up.  All of his medications are controlled substances so I'm forwarding this information to you so Fleet Contras can review for refills.

## 2019-11-30 NOTE — Telephone Encounter (Signed)
Pt has an appt 03/25

## 2019-11-30 NOTE — Telephone Encounter (Signed)
Copied from CRM [id number]. Topic: General - Other >> Nov 30, 2019  2:54 PM Gwenlyn Fudge wrote: Reason for CRM: Pt called stating that his appt was accidentally cancelled via automated reminder. Pt called and rescheduled appt and is requesting to have his medications sent in for him. Please advise.  [id number] [place],  - [address] [place] [address] Phone: [phone] Fax: [phone] Not a 24 hour pharmacy; exact hours not known.

## 2019-11-30 NOTE — Telephone Encounter (Signed)
Spoke with patient.  Notified that medications were sent to bridge till appointment.  Apologized for patient's appointment being cancelled somehow. Patient wasn't upset, just confused but I apologized for the inconvenience and patient was understanding.

## 2019-11-30 NOTE — Telephone Encounter (Signed)
Will refill to bridge him until his appt coming up. I'm assuming it cancelled his appointment because my availability changed

## 2019-12-01 ENCOUNTER — Telehealth: Payer: Self-pay | Admitting: Family Medicine

## 2019-12-07 ENCOUNTER — Encounter: Payer: Self-pay | Admitting: Family Medicine

## 2019-12-07 ENCOUNTER — Other Ambulatory Visit: Payer: Self-pay

## 2019-12-07 ENCOUNTER — Ambulatory Visit (INDEPENDENT_AMBULATORY_CARE_PROVIDER_SITE_OTHER): Payer: PRIVATE HEALTH INSURANCE | Admitting: Family Medicine

## 2019-12-07 VITALS — Ht 66.0 in | Wt 170.5 lb

## 2019-12-07 DIAGNOSIS — M549 Dorsalgia, unspecified: Secondary | ICD-10-CM

## 2019-12-07 DIAGNOSIS — G4700 Insomnia, unspecified: Secondary | ICD-10-CM

## 2019-12-07 DIAGNOSIS — F909 Attention-deficit hyperactivity disorder, unspecified type: Secondary | ICD-10-CM | POA: Diagnosis not present

## 2019-12-07 MED ORDER — CYCLOBENZAPRINE HCL 10 MG PO TABS: 10.0000 mg | ORAL_TABLET | Freq: Three times a day (TID) | ORAL | 0 refills | Status: DC | PRN

## 2019-12-07 MED ORDER — AMPHETAMINE-DEXTROAMPHET ER 20 MG PO CP24: 20.0000 mg | ORAL_CAPSULE | ORAL | 0 refills | Status: DC

## 2019-12-07 MED ORDER — PREDNISONE 10 MG PO TABS: ORAL_TABLET | ORAL | 0 refills | Status: DC

## 2019-12-07 NOTE — Progress Notes (Signed)
Ht 5\' 6"  (1.676 m)   Wt 170 lb 8 oz (77.3 kg)   BMI 27.52 kg/m    Subjective:    Patient ID: Dillon Sosa, male    DOB: [birth year], 24 y.o.   MRN: [id number]  HPI: Dillon Sosa is a 24 y.o. male  Chief Complaint  Patient presents with  . ADHD  . Insomnia  . Back Pain    Patient states he lifted his dog 4 days ago, doesn't feel like a pulled muscle-more like a pinched nerve.    . This visit was completed via MyChart due to the restrictions of the COVID-19 pandemic. All issues as above were discussed and addressed. Physical exam was done as above through visual confirmation on MyChart. If it was felt that the patient should be evaluated in the office, they were directed there. The patient verbally consented to this visit. . Location of the patient: home . Location of the provider: work . Those involved with this call:  . Provider: 25, PA-C . CMA: Roosvelt Maser, CMA . Front Desk/Registration: Elton Sin  . Time spent on call: 25 minutes with patient face to face via video conference. More than 50% of this time was spent in counseling and coordination of care. 5 minutes total spent in review of patient's record and preparation of their chart. I verified patient identity using two factors (patient name and date of birth). Patient consents verbally to being seen via telemedicine visit today.   Presenting today for 3 month ADHD and insomnia follow up. States things are going well, no concerns with his regimen for these. Sleeping well with trazodone and prn ambien, and the adderall seems to be doing well for his focus and ability to keep thoughts organized. Denies side effects.   Lifted his 100 lb dog several days ago and has been having pain since. Mid back, stinging pain. No radiation of pain, numbness, tingling, weakness. Taking ibuprofen prn with mild relief.   Relevant past medical, surgical, family and social history reviewed and updated as indicated.  Interim medical history since our last visit reviewed. Allergies and medications reviewed and updated.  Review of Systems  Per HPI unless specifically indicated above     Objective:    Ht 5\' 6"  (1.676 m)   Wt 170 lb 8 oz (77.3 kg)   BMI 27.52 kg/m   Wt Readings from Last 3 Encounters:  12/07/19 170 lb 8 oz (77.3 kg)  09/06/19 165 lb (74.8 kg)  06/09/19 163 lb (73.9 kg)    Physical Exam Vitals and nursing note reviewed.  Constitutional:      General: He is not in acute distress.    Appearance: Normal appearance.  HENT:     Head: Atraumatic.     Right Ear: External ear normal.     Left Ear: External ear normal.     Nose: Nose normal. No congestion.     Mouth/Throat:     Mouth: Mucous membranes are moist.     Pharynx: Oropharynx is clear.  Eyes:     Extraocular Movements: Extraocular movements intact.     Conjunctiva/sclera: Conjunctivae normal.  Pulmonary:     Effort: Pulmonary effort is normal. No respiratory distress.  Musculoskeletal:        General: Normal range of motion.     Cervical back: Normal range of motion.  Skin:    General: Skin is dry.     Findings: No erythema or rash.  Neurological:  Mental Status: He is oriented to person, place, and time.  Psychiatric:        Mood and Affect: Mood normal.        Thought Content: Thought content normal.        Judgment: Judgment normal.     Results for orders placed or performed in visit on 05/05/19  GC/Chlamydia Probe Amp   Specimen: Urine   UR  Result Value Ref Range   Chlamydia trachomatis, NAA Negative Negative   Neisseria Gonorrhoeae by PCR Negative Negative  HIV Antibody (routine testing w rflx)  Result Value Ref Range   HIV Screen 4th Generation wRfx Non Reactive Non Reactive  HSV(herpes simplex vrs) 1+2 ab-IgG  Result Value Ref Range   HSV 1 Glycoprotein G Ab, IgG <0.91 0.00 - 0.90 index   HSV 2 IgG, Type Spec <0.91 0.00 - 0.90 index  RPR  Result Value Ref Range   RPR Ser Ql Non Reactive  Non Reactive      Assessment & Plan:   Problem List Items Addressed This Visit      Other   ADHD - Primary    Stable and under good control, continue current regimen      Insomnia    Stable and well controlled, continue current regimen       Other Visit Diagnoses    Mid back pain       Suspect muscle strain, tx with prednisone, flexeril, heat, massage. F/u if not improving   Relevant Medications   cyclobenzaprine (FLEXERIL) 10 MG tablet   predniSONE (DELTASONE) 10 MG tablet       Follow up plan: Return in about 3 months (around 03/08/2020) for ADHD, insomnia f/u.

## 2019-12-08 NOTE — Telephone Encounter (Signed)
Already sent  Copied from CRM [id number]. Topic: General - Inquiry >> Dec 07, 2019  4:46 PM Daphine Deutscher D wrote: Reason for CRM: Pt called saying he just had a virtual appt with Roosvelt Maser and was offered prednisone but he declined.  He is calling back  to say he would like to get the prednisone sent to the pharmacy  [facility]  CB# [phone]

## 2019-12-12 ENCOUNTER — Encounter: Payer: Self-pay | Admitting: Family Medicine

## 2019-12-14 NOTE — Assessment & Plan Note (Signed)
Stable and well controlled, continue current regimen 

## 2019-12-14 NOTE — Assessment & Plan Note (Signed)
Stable and under good control, continue current regimen 

## 2019-12-29 ENCOUNTER — Encounter: Payer: Self-pay | Admitting: Family Medicine

## 2019-12-29 ENCOUNTER — Other Ambulatory Visit: Payer: Self-pay

## 2019-12-29 ENCOUNTER — Ambulatory Visit (INDEPENDENT_AMBULATORY_CARE_PROVIDER_SITE_OTHER): Payer: PRIVATE HEALTH INSURANCE | Admitting: Family Medicine

## 2019-12-29 VITALS — BP 132/67 | HR 54 | Temp 98.4°F | Ht 66.0 in | Wt 169.0 lb

## 2019-12-29 DIAGNOSIS — Z0000 Encounter for general adult medical examination without abnormal findings: Secondary | ICD-10-CM

## 2019-12-29 DIAGNOSIS — Z111 Encounter for screening for respiratory tuberculosis: Secondary | ICD-10-CM | POA: Diagnosis not present

## 2019-12-29 DIAGNOSIS — Z0184 Encounter for antibody response examination: Secondary | ICD-10-CM

## 2019-12-29 LAB — UA/M W/RFLX CULTURE, ROUTINE
Bilirubin, UA: NEGATIVE
Glucose, UA: NEGATIVE
Leukocytes,UA: NEGATIVE
Nitrite, UA: NEGATIVE
Protein,UA: NEGATIVE
RBC, UA: NEGATIVE
Specific Gravity, UA: 1.015 (ref 1.005–1.030)
Urobilinogen, Ur: 0.2 mg/dL (ref 0.2–1.0)
pH, UA: 6 (ref 5.0–7.5)

## 2019-12-29 NOTE — Progress Notes (Signed)
BP 132/67   Pulse (!) 54   Temp 98.4 F (36.9 C) (Oral)   Ht 5\' 6"  (1.676 m)   Wt 169 lb (76.7 kg)   SpO2 98%   BMI 27.28 kg/m    Subjective:    Patient ID: Dillon Sosa, male    DOB: October 10, 1995, 24 y.o.   MRN: [id number]  HPI: Dillon Sosa is a 24 y.o. male presenting on 12/29/2019 for comprehensive medical examination. 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   He currently lives with: Interim Problems from his last visit: no  Depression Screen done today and results listed below:  Depression screen [facility] 2/9 12/29/2019 06/09/2019 04/29/2018  Decreased Interest 0 0 0  Down, Depressed, Hopeless 0 0 0  PHQ - 2 Score 0 0 0  Altered sleeping 1 2 3   Tired, decreased energy 0 0 0  Change in appetite 0 0 0  Feeling bad or failure about yourself  1 0 0  Trouble concentrating 1 0 1  Moving slowly or fidgety/restless 0 1 0  Suicidal thoughts 0 0 0  PHQ-9 Score 3 3 4   Difficult doing work/chores - Not difficult at all -    The patient does not have a history of falls. I did complete a risk assessment for falls. A plan of care for falls was documented.   Past Medical History:  Past Medical History:  Diagnosis Date  . ADHD   . Insomnia     Surgical History:  Past Surgical History:  Procedure Laterality Date  . FOOT SURGERY Left 2016    Medications:  Current Outpatient Medications on File Prior to Visit  Medication Sig  . [START ON 01/06/2020] amphetamine-dextroamphetamine (ADDERALL XR) 20 MG 24 hr capsule Take 1 capsule (20 mg total) by mouth every morning.  . traZODone (DESYREL) 150 MG tablet Take 1 tablet (150 mg total) by mouth at bedtime.  zolpidem (AMBIEN) 5 MG tablet Take 1 tablet (5 mg total) by mouth at bedtime as needed for sleep.  2017 ON 02/05/2020] amphetamine-dextroamphetamine (ADDERALL XR) 20 MG 24 hr capsule Take 1 capsule (20 mg total) by mouth every morning. (Patient not taking:  Reported on 12/29/2019)   No current facility-administered medications on file prior to visit.    Allergies:  No Known Allergies  Social History:  Social History   Socioeconomic History  . Marital status: Single    Spouse name: Not on file  . Number of children: Not on file  . Years of education: Not on file  . Highest education level: Not on file  Occupational History  . Not on file  Tobacco Use  . Smoking status: Former Smoker    Years: 2.00    Types: Cigarettes  . Smokeless tobacco: Former Dillon Sosa    Types: Chew    Quit date: 10/15/2017  Substance and Sexual Activity  . Alcohol use: Not Currently  . Drug use: No  . Sexual activity: Yes  Other Topics Concern  . Not on file  Social History Narrative  . Not on file   Social Determinants of Health   Financial Resource Strain:   . Difficulty of Paying Living Expenses:   Food Insecurity:   . Worried About 12/31/2019 in the Last Year:   . Neurosurgeon in the Last Year:   Transportation Needs:   . 12/13/2017 (Medical):   Programme researcher, broadcasting/film/video Lack of Transportation (  Non-Medical):   Physical Activity:   . Days of Exercise per Week:   . Minutes of Exercise per Session:   Stress:   . Feeling of Stress :   Social Connections:   . Frequency of Communication with Friends and Family:   . Frequency of Social Gatherings with Friends and Family:   . Attends Religious Services:   . Active Member of Clubs or Organizations:   . Attends Banker Meetings:   Marland Kitchen Marital Status:   Intimate Partner Violence:   . Fear of Current or Ex-Partner:   . Emotionally Abused:   Marland Kitchen Physically Abused:   . Sexually Abused:    Social History   Tobacco Use  Smoking Status Former Smoker  . Years: 2.00  . Types: Cigarettes  Smokeless Tobacco Former Neurosurgeon  . Types: Chew  . Quit date: 10/15/2017   Social History   Substance and Sexual Activity  Alcohol Use Not Currently    Family History:  Family History  Problem Relation  Age of Onset  . Breast cancer Mother   . Insomnia Father   . Heart attack Maternal Grandmother     Past medical history, surgical history, medications, allergies, family history and social history reviewed with patient today and changes made to appropriate areas of the chart.   Review of Systems - General ROS: negative Psychological ROS: negative Ophthalmic ROS: negative ENT ROS: negative Allergy and Immunology ROS: negative Hematological and Lymphatic ROS: negative Endocrine ROS: negative Breast ROS: negative for breast lumps Respiratory ROS: no cough, shortness of breath, or wheezing Cardiovascular ROS: no chest pain or dyspnea on exertion Gastrointestinal ROS: no abdominal pain, change in bowel habits, or black or bloody stools Genito-Urinary ROS: no dysuria, trouble voiding, or hematuria Musculoskeletal ROS: negative Neurological ROS: no TIA or stroke symptoms Dermatological ROS: negative All other ROS negative except what is listed above and in the HPI.      Objective:    BP 132/67   Pulse (!) 54   Temp 98.4 F (36.9 C) (Oral)   Ht 5\' 6"  (1.676 m)   Wt 169 lb (76.7 kg)   SpO2 98%   BMI 27.28 kg/m   Wt Readings from Last 3 Encounters:  12/29/19 169 lb (76.7 kg)  12/07/19 170 lb 8 oz (77.3 kg)  09/06/19 165 lb (74.8 kg)    Physical Exam Vitals and nursing note reviewed.  Constitutional:      General: He is not in acute distress.    Appearance: He is well-developed.  HENT:     Head: Atraumatic.     Right Ear: Tympanic membrane and external ear normal.     Left Ear: Tympanic membrane and external ear normal.     Nose: Nose normal.     Mouth/Throat:     Mouth: Mucous membranes are moist.     Pharynx: Oropharynx is clear.  Eyes:     General: No scleral icterus.    Conjunctiva/sclera: Conjunctivae normal.     Pupils: Pupils are equal, round, and reactive to light.  Cardiovascular:     Rate and Rhythm: Normal rate and regular rhythm.     Heart sounds: Normal  heart sounds. No murmur.  Pulmonary:     Effort: Pulmonary effort is normal. No respiratory distress.     Breath sounds: Normal breath sounds.  Abdominal:     General: Bowel sounds are normal. There is no distension.     Palpations: Abdomen is soft. There is no mass.  Tenderness: There is no abdominal tenderness. There is no guarding.  Genitourinary:    Comments: GU exam declined Musculoskeletal:        General: No tenderness. Normal range of motion.     Cervical back: Normal range of motion and neck supple.  Skin:    General: Skin is warm and dry.     Findings: No rash.  Neurological:     General: No focal deficit present.     Mental Status: He is alert and oriented to person, place, and time.     Deep Tendon Reflexes: Reflexes are normal and symmetric.  Psychiatric:        Mood and Affect: Mood normal.        Behavior: Behavior normal.        Thought Content: Thought content normal.        Judgment: Judgment normal.     Results for orders placed or performed in visit on 12/29/19  QuantiFERON-TB Gold Plus  Result Value Ref Range   QuantiFERON Incubation Incubation performed.    QuantiFERON Criteria Comment    QuantiFERON TB1 Ag Value 0.10 IU/mL   QuantiFERON TB2 Ag Value 0.10 IU/mL   QuantiFERON Nil Value 0.10 IU/mL   QuantiFERON Mitogen Value >10.00 IU/mL   QuantiFERON-TB Gold Plus Negative Negative  CBC with Differential/Platelet  Result Value Ref Range   WBC 4.5 3.4 - 10.8 x10E3/uL   RBC 4.68 4.14 - 5.80 x10E6/uL   Hemoglobin 14.4 13.0 - 17.7 g/dL   Hematocrit 51.0 25.8 - 51.0 %   MCV 89 79 - 97 fL   MCH 30.8 26.6 - 33.0 pg   MCHC 34.7 31.5 - 35.7 g/dL   RDW 52.7 78.2 - 42.3 %   Platelets 183 150 - 450 x10E3/uL   Neutrophils 45 Not Estab. %   Lymphs 43 Not Estab. %   Monocytes 10 Not Estab. %   Eos 1 Not Estab. %   Basos 1 Not Estab. %   Neutrophils Absolute 2.0 1.4 - 7.0 x10E3/uL   Lymphocytes Absolute 2.0 0.7 - 3.1 x10E3/uL   Monocytes Absolute 0.4 0.1  - 0.9 x10E3/uL   EOS (ABSOLUTE) 0.1 0.0 - 0.4 x10E3/uL   Basophils Absolute 0.0 0.0 - 0.2 x10E3/uL   Immature Granulocytes 0 Not Estab. %   Immature Grans (Abs) 0.0 0.0 - 0.1 x10E3/uL  Comprehensive metabolic panel  Result Value Ref Range   Glucose 85 65 - 99 mg/dL   BUN 12 6 - 20 mg/dL   Creatinine, Ser 5.36 0.76 - 1.27 mg/dL   GFR calc non Af Amer 119 >59 mL/min/1.73   GFR calc Af Amer 138 >59 mL/min/1.73   BUN/Creatinine Ratio 13 9 - 20   Sodium 139 134 - 144 mmol/L   Potassium 4.1 3.5 - 5.2 mmol/L   Chloride 103 96 - 106 mmol/L   CO2 23 20 - 29 mmol/L   Calcium 9.6 8.7 - 10.2 mg/dL   Total Protein 7.4 6.0 - 8.5 g/dL   Albumin 4.9 4.1 - 5.2 g/dL   Globulin, Total 2.5 1.5 - 4.5 g/dL   Albumin/Globulin Ratio 2.0 1.2 - 2.2   Bilirubin Total 0.4 0.0 - 1.2 mg/dL   Alkaline Phosphatase 70 39 - 117 IU/L   AST 29 0 - 40 IU/L   ALT 33 0 - 44 IU/L  Lipid Panel w/o Chol/HDL Ratio  Result Value Ref Range   Cholesterol, Total 153 100 - 199 mg/dL   Triglycerides 144 0 - 149 mg/dL  HDL 59 >39 mg/dL   VLDL Cholesterol Cal 22 5 - 40 mg/dL   LDL Chol Calc (NIH) 72 0 - 99 mg/dL  TSH  Result Value Ref Range   TSH 1.060 0.450 - 4.500 uIU/mL  UA/M w/rflx Culture, Routine   Specimen: Urine   URINE  Result Value Ref Range   Specific Gravity, UA 1.015 1.005 - 1.030   pH, UA 6.0 5.0 - 7.5   Color, UA Yellow Yellow   Appearance Ur Clear Clear   Leukocytes,UA Negative Negative   Protein,UA Negative Negative/Trace   Glucose, UA Negative Negative   Ketones, UA 1+ (A) Negative   RBC, UA Negative Negative   Bilirubin, UA Negative Negative   Urobilinogen, Ur 0.2 0.2 - 1.0 mg/dL   Nitrite, UA Negative Negative      Assessment & Plan:   Problem List Items Addressed This Visit    None    Visit Diagnoses    Immunity status testing    -  Primary   Screening for tuberculosis       Relevant Orders   QuantiFERON-TB Gold Plus (Completed)   Annual physical exam       Relevant Orders   CBC  with Differential/Platelet (Completed)   Comprehensive metabolic panel (Completed)   Lipid Panel w/o Chol/HDL Ratio (Completed)   TSH (Completed)   UA/M w/rflx Culture, Routine (Completed)       Discussed aspirin prophylaxis for myocardial infarction prevention and decision was it was not indicated  LABORATORY TESTING:  Health maintenance labs ordered today as discussed above.   The natural history of prostate cancer and ongoing controversy regarding screening and potential treatment outcomes of prostate cancer has been discussed with the patient. The meaning of a false positive PSA and a false negative PSA has been discussed. He indicates understanding of the limitations of this screening test and wishes not to proceed with screening PSA testing.   IMMUNIZATIONS:   - Tdap: Tetanus vaccination status reviewed: last tetanus booster within 10 years. - Influenza: due, but just received COVID vaccine so will need to wait the full 14 days prior to getting another vaccine  PATIENT COUNSELING:    Sexuality: Discussed sexually transmitted diseases, partner selection, use of condoms, avoidance of unintended pregnancy  and contraceptive alternatives.   Advised to avoid cigarette smoking.  I discussed with the patient that most people either abstain from alcohol or drink within safe limits (<=14/week and <=4 drinks/occasion for males, <=7/weeks and <= 3 drinks/occasion for females) and that the risk for alcohol disorders and other health effects rises proportionally with the number of drinks per week and how often a drinker exceeds daily limits.  Discussed cessation/primary prevention of drug use and availability of treatment for abuse.   Diet: Encouraged to adjust caloric intake to maintain  or achieve ideal body weight, to reduce intake of dietary saturated fat and total fat, to limit sodium intake by avoiding high sodium foods and not adding table salt, and to maintain adequate dietary  potassium and calcium preferably from fresh fruits, vegetables, and low-fat dairy products.    stressed the importance of regular exercise  Injury prevention: Discussed safety belts, safety helmets, smoke detector, smoking near bedding or upholstery.   Dental health: Discussed importance of regular tooth brushing, flossing, and dental visits.   Follow up plan: NEXT PREVENTATIVE PHYSICAL DUE IN 1 YEAR. Return for as scheduled.

## 2020-01-01 LAB — COMPREHENSIVE METABOLIC PANEL
ALT: 33 IU/L (ref 0–44)
AST: 29 IU/L (ref 0–40)
Albumin/Globulin Ratio: 2 (ref 1.2–2.2)
Albumin: 4.9 g/dL (ref 4.1–5.2)
Alkaline Phosphatase: 70 IU/L (ref 39–117)
BUN/Creatinine Ratio: 13 (ref 9–20)
BUN: 12 mg/dL (ref 6–20)
Bilirubin Total: 0.4 mg/dL (ref 0.0–1.2)
CO2: 23 mmol/L (ref 20–29)
Calcium: 9.6 mg/dL (ref 8.7–10.2)
Chloride: 103 mmol/L (ref 96–106)
Creatinine, Ser: 0.9 mg/dL (ref 0.76–1.27)
GFR calc Af Amer: 138 mL/min/{1.73_m2} (ref >59)
GFR calc non Af Amer: 119 mL/min/{1.73_m2} (ref >59)
Globulin, Total: 2.5 g/dL (ref 1.5–4.5)
Glucose: 85 mg/dL (ref 65–99)
Potassium: 4.1 mmol/L (ref 3.5–5.2)
Sodium: 139 mmol/L (ref 134–144)
Total Protein: 7.4 g/dL (ref 6.0–8.5)

## 2020-01-01 LAB — CBC WITH DIFFERENTIAL/PLATELET
Basophils Absolute: 0 10*3/uL (ref 0.0–0.2)
Basos: 1 %
EOS (ABSOLUTE): 0.1 10*3/uL (ref 0.0–0.4)
Eos: 1 %
Hematocrit: 41.5 % (ref 37.5–51.0)
Hemoglobin: 14.4 g/dL (ref 13.0–17.7)
Immature Grans (Abs): 0 10*3/uL (ref 0.0–0.1)
Immature Granulocytes: 0 %
Lymphocytes Absolute: 2 10*3/uL (ref 0.7–3.1)
Lymphs: 43 %
MCH: 30.8 pg (ref 26.6–33.0)
MCHC: 34.7 g/dL (ref 31.5–35.7)
MCV: 89 fL (ref 79–97)
Monocytes Absolute: 0.4 10*3/uL (ref 0.1–0.9)
Monocytes: 10 %
Neutrophils Absolute: 2 10*3/uL (ref 1.4–7.0)
Neutrophils: 45 %
Platelets: 183 10*3/uL (ref 150–450)
RBC: 4.68 x10E6/uL (ref 4.14–5.80)
RDW: 12.7 % (ref 11.6–15.4)
WBC: 4.5 10*3/uL (ref 3.4–10.8)

## 2020-01-01 LAB — TSH: TSH: 1.06 u[IU]/mL (ref 0.450–4.500)

## 2020-01-01 LAB — QUANTIFERON-TB GOLD PLUS
QuantiFERON Mitogen Value: >10 IU/mL
QuantiFERON Nil Value: 0.1 IU/mL
QuantiFERON TB1 Ag Value: 0.1 IU/mL
QuantiFERON TB2 Ag Value: 0.1 IU/mL
QuantiFERON-TB Gold Plus: NEGATIVE

## 2020-01-01 LAB — LIPID PANEL W/O CHOL/HDL RATIO
Cholesterol, Total: 153 mg/dL (ref 100–199)
HDL: 59 mg/dL (ref >39)
LDL Chol Calc (NIH): 72 mg/dL (ref 0–99)
Triglycerides: 125 mg/dL (ref 0–149)
VLDL Cholesterol Cal: 22 mg/dL (ref 5–40)

## 2020-01-02 ENCOUNTER — Telehealth: Payer: Self-pay

## 2020-01-02 NOTE — Telephone Encounter (Signed)
Called patient to let him know paperwork is ready for pick up.

## 2020-01-03 NOTE — Telephone Encounter (Signed)
Patient came by to pick up his paperwork. Copied and placed it in scan basket.

## 2020-02-22 ENCOUNTER — Encounter: Payer: Self-pay | Admitting: Family Medicine

## 2020-03-01 ENCOUNTER — Telehealth (INDEPENDENT_AMBULATORY_CARE_PROVIDER_SITE_OTHER): Payer: PRIVATE HEALTH INSURANCE | Admitting: Family Medicine

## 2020-03-01 ENCOUNTER — Encounter: Payer: Self-pay | Admitting: Family Medicine

## 2020-03-01 VITALS — Wt 165.0 lb

## 2020-03-01 DIAGNOSIS — F909 Attention-deficit hyperactivity disorder, unspecified type: Secondary | ICD-10-CM

## 2020-03-01 DIAGNOSIS — G4700 Insomnia, unspecified: Secondary | ICD-10-CM | POA: Diagnosis not present

## 2020-03-01 DIAGNOSIS — Z79899 Other long term (current) drug therapy: Secondary | ICD-10-CM

## 2020-03-01 MED ORDER — TRAZODONE HCL 150 MG PO TABS: 150.0000 mg | ORAL_TABLET | Freq: Every day | ORAL | 1 refills | Status: DC

## 2020-03-01 MED ORDER — AMPHETAMINE-DEXTROAMPHET ER 20 MG PO CP24: 20.0000 mg | ORAL_CAPSULE | ORAL | 0 refills | Status: DC

## 2020-03-01 MED ORDER — ZOLPIDEM TARTRATE 5 MG PO TABS: 5.0000 mg | ORAL_TABLET | Freq: Every evening | ORAL | 0 refills | Status: DC | PRN

## 2020-03-01 MED ORDER — AMPHETAMINE-DEXTROAMPHET ER 20 MG PO CP24
20.0000 mg | ORAL_CAPSULE | ORAL | 0 refills | Status: DC
Start: 2020-04-30 — End: 2020-06-07

## 2020-03-01 NOTE — Progress Notes (Signed)
Wt 165 lb (74.8 kg)   BMI 26.63 kg/m    Subjective:    Patient ID: Dillon Sosa, male    DOB: [birth year], 24 y.o.   MRN: [id number]  HPI: Dillon Sosa is a 24 y.o. male  Chief Complaint  Patient presents with  . ADHD  . Anxiety    . This visit was completed via MyChart due to the restrictions of the COVID-19 pandemic. All issues as above were discussed and addressed. Physical exam was done as above through visual confirmation on MyChart. If it was felt that the patient should be evaluated in the office, they were directed there. The patient verbally consented to this visit. . Location of the patient: home . Location of the provider: work . Those involved with this call:  . Provider: Roosvelt Maser, PA-C . CMA: Elton Sin, CMA . Front Desk/Registration: Harriet Pho  . Time spent on call: 15 minutes with patient face to face via video conference. More than 50% of this time was spent in counseling and coordination of care. 5 minutes total spent in review of patient's record and preparation of their chart. I verified patient identity using two factors (patient name and date of birth). Patient consents verbally to being seen via telemedicine visit today.   Presenting today for 3 month f/u insomnia and ADHD.   Currently taking trazodone nightly and ambien prn for sleep, which has worked very well for him. Denies side effects, morning grogginess. Feeling rested throughout day.   On adderall regimen for ADHD which works well, keeps him focused without side effects. Denies CP, SOB, palpitations, appetite issues.   Relevant past medical, surgical, family and social history reviewed and updated as indicated. Interim medical history since our last visit reviewed. Allergies and medications reviewed and updated.  Review of Systems  Per HPI unless specifically indicated above     Objective:    Wt 165 lb (74.8 kg)   BMI 26.63 kg/m   Wt Readings from Last 3 Encounters:   03/01/20 165 lb (74.8 kg)  12/29/19 169 lb (76.7 kg)  12/07/19 170 lb 8 oz (77.3 kg)    Physical Exam Vitals and nursing note reviewed.  Constitutional:      General: He is not in acute distress.    Appearance: Normal appearance.  HENT:     Head: Atraumatic.     Right Ear: External ear normal.     Left Ear: External ear normal.     Nose: Nose normal. No congestion.     Mouth/Throat:     Mouth: Mucous membranes are moist.     Pharynx: Oropharynx is clear.  Eyes:     Extraocular Movements: Extraocular movements intact.     Conjunctiva/sclera: Conjunctivae normal.  Pulmonary:     Effort: Pulmonary effort is normal. No respiratory distress.  Musculoskeletal:        General: Normal range of motion.     Cervical back: Normal range of motion.  Skin:    General: Skin is dry.     Findings: No erythema or rash.  Neurological:     Mental Status: He is oriented to person, place, and time.  Psychiatric:        Mood and Affect: Mood normal.        Thought Content: Thought content normal.        Judgment: Judgment normal.     Results for orders placed or performed in visit on 12/29/19  QuantiFERON-TB Gold Plus  Result  Value Ref Range   QuantiFERON Incubation Incubation performed.    QuantiFERON Criteria Comment    QuantiFERON TB1 Ag Value 0.10 IU/mL   QuantiFERON TB2 Ag Value 0.10 IU/mL   QuantiFERON Nil Value 0.10 IU/mL   QuantiFERON Mitogen Value >10.00 IU/mL   QuantiFERON-TB Gold Plus Negative Negative  CBC with Differential/Platelet  Result Value Ref Range   WBC 4.5 3.4 - 10.8 x10E3/uL   RBC 4.68 4.14 - 5.80 x10E6/uL   Hemoglobin 14.4 13.0 - 17.7 g/dL   Hematocrit 41.5 37.5 - 51.0 %   MCV 89 79 - 97 fL   MCH 30.8 26.6 - 33.0 pg   MCHC 34.7 31 - 35 g/dL   RDW 12.7 11.6 - 15.4 %   Platelets 183 150 - 450 x10E3/uL   Neutrophils 45 Not Estab. %   Lymphs 43 Not Estab. %   Monocytes 10 Not Estab. %   Eos 1 Not Estab. %   Basos 1 Not Estab. %   Neutrophils Absolute 2.0  1 - 7 x10E3/uL   Lymphocytes Absolute 2.0 0 - 3 x10E3/uL   Monocytes Absolute 0.4 0 - 0 x10E3/uL   EOS (ABSOLUTE) 0.1 0.0 - 0.4 x10E3/uL   Basophils Absolute 0.0 0 - 0 x10E3/uL   Immature Granulocytes 0 Not Estab. %   Immature Grans (Abs) 0.0 0.0 - 0.1 x10E3/uL  Comprehensive metabolic panel  Result Value Ref Range   Glucose 85 65 - 99 mg/dL   BUN 12 6 - 20 mg/dL   Creatinine, Ser 0.90 0.76 - 1.27 mg/dL   GFR calc non Af Amer 119 >59 mL/min/1.73   GFR calc Af Amer 138 >59 mL/min/1.73   BUN/Creatinine Ratio 13 9 - 20   Sodium 139 134 - 144 mmol/L   Potassium 4.1 3.5 - 5.2 mmol/L   Chloride 103 96 - 106 mmol/L   CO2 23 20 - 29 mmol/L   Calcium 9.6 8.7 - 10.2 mg/dL   Total Protein 7.4 6.0 - 8.5 g/dL   Albumin 4.9 4.1 - 5.2 g/dL   Globulin, Total 2.5 1.5 - 4.5 g/dL   Albumin/Globulin Ratio 2.0 1.2 - 2.2   Bilirubin Total 0.4 0.0 - 1.2 mg/dL   Alkaline Phosphatase 70 39 - 117 IU/L   AST 29 0 - 40 IU/L   ALT 33 0 - 44 IU/L  Lipid Panel w/o Chol/HDL Ratio  Result Value Ref Range   Cholesterol, Total 153 100 - 199 mg/dL   Triglycerides 125 0 - 149 mg/dL   HDL 59 >39 mg/dL   VLDL Cholesterol Cal 22 5 - 40 mg/dL   LDL Chol Calc (NIH) 72 0 - 99 mg/dL  TSH  Result Value Ref Range   TSH 1.060 0.450 - 4.500 uIU/mL  UA/M w/rflx Culture, Routine   Specimen: Urine   URINE  Result Value Ref Range   Specific Gravity, UA 1.015 1.005 - 1.030   pH, UA 6.0 5.0 - 7.5   Color, UA Yellow Yellow   Appearance Ur Clear Clear   Leukocytes,UA Negative Negative   Protein,UA Negative Negative/Trace   Glucose, UA Negative Negative   Ketones, UA 1+ (A) Negative   RBC, UA Negative Negative   Bilirubin, UA Negative Negative   Urobilinogen, Ur 0.2 0.2 - 1.0 mg/dL   Nitrite, UA Negative Negative      Assessment & Plan:   Problem List Items Addressed This Visit      Other   ADHD - Primary    Stable and  well controlled, continue current regimen      Insomnia    Stable and under good control,  continue current regimen      Controlled substance agreement signed       Follow up plan: Return in about 3 months (around 06/01/2020).

## 2020-03-05 NOTE — Assessment & Plan Note (Signed)
Stable and well controlled, continue current regimen 

## 2020-03-05 NOTE — Assessment & Plan Note (Signed)
Stable and under good control, continue current regimen 

## 2020-03-06 ENCOUNTER — Encounter: Payer: Self-pay | Admitting: Family Medicine

## 2020-03-06 ENCOUNTER — Telehealth: Payer: Self-pay | Admitting: Family Medicine

## 2020-03-06 NOTE — Telephone Encounter (Signed)
Unable to lvm, sent mychart letter to make this 3 month ADHD, sleep f/u

## 2020-03-06 NOTE — Telephone Encounter (Signed)
-----   Message from Particia Nearing, [place] sent at 03/05/2020 10:23 PM EDT ----- 3 month ADHD, sleep f/u

## 2020-04-02 ENCOUNTER — Encounter: Payer: Self-pay | Admitting: Family Medicine

## 2020-05-31 ENCOUNTER — Ambulatory Visit: Payer: PRIVATE HEALTH INSURANCE | Admitting: Nurse Practitioner

## 2020-05-31 ENCOUNTER — Ambulatory Visit: Payer: PRIVATE HEALTH INSURANCE | Admitting: Family Medicine

## 2020-06-07 ENCOUNTER — Encounter: Payer: Self-pay | Admitting: Nurse Practitioner

## 2020-06-07 ENCOUNTER — Other Ambulatory Visit: Payer: Self-pay

## 2020-06-07 ENCOUNTER — Ambulatory Visit (INDEPENDENT_AMBULATORY_CARE_PROVIDER_SITE_OTHER): Payer: PRIVATE HEALTH INSURANCE | Admitting: Nurse Practitioner

## 2020-06-07 VITALS — BP 110/63 | HR 44 | Temp 98.5°F | Wt 167.0 lb

## 2020-06-07 DIAGNOSIS — F909 Attention-deficit hyperactivity disorder, unspecified type: Secondary | ICD-10-CM

## 2020-06-07 DIAGNOSIS — G4700 Insomnia, unspecified: Secondary | ICD-10-CM

## 2020-06-07 MED ORDER — AMPHETAMINE-DEXTROAMPHET ER 20 MG PO CP24
20.0000 mg | ORAL_CAPSULE | Freq: Every day | ORAL | 0 refills | Status: DC
Start: 2020-08-08 — End: 2020-08-30

## 2020-06-07 MED ORDER — AMPHETAMINE-DEXTROAMPHET ER 20 MG PO CP24: 20.0000 mg | ORAL_CAPSULE | Freq: Every morning | ORAL | 0 refills | Status: DC

## 2020-06-07 MED ORDER — AMPHETAMINE-DEXTROAMPHET ER 20 MG PO CP24
20.0000 mg | ORAL_CAPSULE | Freq: Every morning | ORAL | 0 refills | Status: DC
Start: 2020-07-08 — End: 2020-08-30

## 2020-06-07 MED ORDER — ZOLPIDEM TARTRATE 5 MG PO TABS: 5.0000 mg | ORAL_TABLET | Freq: Every evening | ORAL | 0 refills | Status: DC | PRN

## 2020-06-07 NOTE — Progress Notes (Signed)
BP 110/63    Pulse (!) 44    Temp 98.5 F (36.9 C) (Oral)    Wt 167 lb (75.8 kg)    SpO2 100%    BMI 26.95 kg/m    Subjective:    Patient ID: Dillon Sosa, male    DOB: 04/14/1996, 24 y.o.   MRN: [id number]  HPI: Dillon Sosa is a 24 y.o. male presenting for follow up of ADD and insomnia.  Chief Complaint  Patient presents with   ADHD   Insomnia   ADHD Reports doing well on the Adderall XR 20 mg daily.  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 ADHD status: controlled Satisfied with current therapy: no Medication compliance:  excellent compliance Controlled substance contract: yes Previous psychiatry evaluation: no Previous medications: Adderall XR 20 mg    Taking meds on weekends/vacations: occasionally Work/school performance:  good Difficulty sustaining attention/completing tasks: no Distracted by extraneous stimuli: no Does not listen when spoken to: no  Fidgets with hands or feet: yes Unable to stay in seat: no Blurts out/interrupts others: no ADHD Medication Side Effects: no    Decreased appetite: no    Headache: no    Sleeping disturbance pattern: no    Irritability: no    Rebound effects (worse than baseline) off medication: no    Anxiousness: no    Dizziness: no    Tics: no  INSOMNIA Taking trazodone nightly 125-150 mg and Ambien prn for sleep. Duration: chronic Satisfied with sleep quality: yes Difficulty falling asleep: yes Difficulty staying asleep: no Waking a few hours after sleep onset: no Early morning awakenings: no Daytime hypersomnolence: yes Wakes feeling refreshed: yes Good sleep hygiene: yes Apnea: no Snoring: no Depressed/anxious mood: yes Recent stress: yes Restless legs/nocturnal leg cramps: no Chronic pain/arthritis: no History of sleep study: no Treatments attempted: Ambien, trazodone  No Known Allergies  Outpatient  Encounter Medications as of 06/07/2020  Medication Sig   amphetamine-dextroamphetamine (ADDERALL XR) 20 MG 24 hr capsule Take 1 capsule (20 mg total) by mouth in the morning.   traZODone (DESYREL) 150 MG tablet Take 1 tablet (150 mg total) by mouth at bedtime.   zolpidem (AMBIEN) 5 MG tablet Take 1 tablet (5 mg total) by mouth at bedtime as needed for sleep.   [DISCONTINUED] amphetamine-dextroamphetamine (ADDERALL XR) 20 MG 24 hr capsule Take 1 capsule (20 mg total) by mouth every morning.   [DISCONTINUED] amphetamine-dextroamphetamine (ADDERALL XR) 20 MG 24 hr capsule Take 1 capsule (20 mg total) by mouth every morning.   [DISCONTINUED] amphetamine-dextroamphetamine (ADDERALL XR) 20 MG 24 hr capsule Take 1 capsule (20 mg total) by mouth every morning.   [DISCONTINUED] zolpidem (AMBIEN) 5 MG tablet Take 1 tablet (5 mg total) by mouth at bedtime as needed for sleep.   [START ON 07/08/2020] amphetamine-dextroamphetamine (ADDERALL XR) 20 MG 24 hr capsule Take 1 capsule (20 mg total) by mouth in the morning.   [START ON 08/08/2020] amphetamine-dextroamphetamine (ADDERALL XR) 20 MG 24 hr capsule Take 1 capsule (20 mg total) by mouth daily.   No facility-administered encounter medications on file as of 06/07/2020.   Patient Active Problem List   Diagnosis Date Noted   Controlled substance agreement signed 05/01/2018   ADHD 10/10/2017   Insomnia 10/10/2017   Past Medical History:  Diagnosis Date   ADHD    Insomnia    Relevant past medical, surgical, family and social history reviewed  and updated as indicated. Interim medical history since our last visit reviewed.  Review of Systems  Constitutional: Negative.  Negative for activity change, diaphoresis, fatigue, fever and unexpected weight change.  Skin: Negative.   Neurological: Negative.  Negative for dizziness, weakness, light-headedness, numbness and headaches.  Psychiatric/Behavioral: Positive for sleep disturbance. Negative  for agitation, confusion and hallucinations. The patient is not nervous/anxious and is not hyperactive.     Per HPI unless specifically indicated above     Objective:    BP 110/63    Pulse (!) 44    Temp 98.5 F (36.9 C) (Oral)    Wt 167 lb (75.8 kg)    SpO2 100%    BMI 26.95 kg/m   Wt Readings from Last 3 Encounters:  06/07/20 167 lb (75.8 kg)  03/01/20 165 lb (74.8 kg)  12/29/19 169 lb (76.7 kg)    Physical Exam Vitals and nursing note reviewed.  Constitutional:      General: He is not in acute distress.    Appearance: Normal appearance. He is not toxic-appearing.  Skin:    General: Skin is warm and dry.     Coloration: Skin is not jaundiced or pale.  Neurological:     General: No focal deficit present.     Mental Status: He is alert and oriented to person, place, and time.     Motor: No weakness.     Gait: Gait normal.  Psychiatric:        Mood and Affect: Mood normal.        Behavior: Behavior normal.        Thought Content: Thought content normal.        Judgment: Judgment normal.       Assessment & Plan:   Problem List Items Addressed This Visit      Other   ADHD - Primary    Chronic, stable on Adderall XR 20 mg daily.  PDMP reviewed and appropriate.  Prescription refilled for 3 months, follow-up in 3 months.      Insomnia    Chronic, stable on nightly trazodone with seldom use of Ambien.  PDMP reviewed and appropriate.  Refill of Ambien given.  To consider cognitive behavioral therapy in the future for sleep issues.          Follow up plan: Return in about 3 months (around 09/06/2020) for adhd, insomina follow up.

## 2020-06-07 NOTE — Assessment & Plan Note (Signed)
Chronic, stable on Adderall XR 20 mg daily.  PDMP reviewed and appropriate.  Prescription refilled for 3 months, follow-up in 3 months.

## 2020-06-07 NOTE — Assessment & Plan Note (Signed)
Chronic, stable on nightly trazodone with seldom use of Ambien.  PDMP reviewed and appropriate.  Refill of Ambien given.  To consider cognitive behavioral therapy in the future for sleep issues.

## 2020-06-07 NOTE — Patient Instructions (Signed)

## 2020-08-16 ENCOUNTER — Encounter: Payer: Self-pay | Admitting: Nurse Practitioner

## 2020-08-30 ENCOUNTER — Ambulatory Visit: Payer: PRIVATE HEALTH INSURANCE | Admitting: Nurse Practitioner

## 2020-08-30 ENCOUNTER — Encounter: Payer: Self-pay | Admitting: Unknown Physician Specialty

## 2020-08-30 ENCOUNTER — Ambulatory Visit: Payer: PRIVATE HEALTH INSURANCE | Admitting: Unknown Physician Specialty

## 2020-08-30 ENCOUNTER — Other Ambulatory Visit: Payer: Self-pay

## 2020-08-30 VITALS — BP 125/70 | HR 41 | Temp 97.5°F | Wt 165.2 lb

## 2020-08-30 DIAGNOSIS — F909 Attention-deficit hyperactivity disorder, unspecified type: Secondary | ICD-10-CM

## 2020-08-30 DIAGNOSIS — R200 Anesthesia of skin: Secondary | ICD-10-CM | POA: Diagnosis not present

## 2020-08-30 DIAGNOSIS — G4700 Insomnia, unspecified: Secondary | ICD-10-CM | POA: Diagnosis not present

## 2020-08-30 MED ORDER — ZOLPIDEM TARTRATE 5 MG PO TABS: 5.0000 mg | ORAL_TABLET | Freq: Every evening | ORAL | 0 refills | Status: DC | PRN

## 2020-08-30 MED ORDER — AMPHETAMINE-DEXTROAMPHET ER 20 MG PO CP24: 20.0000 mg | ORAL_CAPSULE | ORAL | 0 refills | Status: DC

## 2020-08-30 MED ORDER — AMPHETAMINE-DEXTROAMPHET ER 20 MG PO CP24
20.0000 mg | ORAL_CAPSULE | ORAL | 0 refills | Status: DC
Start: 2020-11-11 — End: 2020-12-06

## 2020-08-30 MED ORDER — TRAZODONE HCL 150 MG PO TABS
150.0000 mg | ORAL_TABLET | Freq: Every day | ORAL | 1 refills | Status: DC
Start: 2020-08-30 — End: 2020-12-06

## 2020-08-30 NOTE — Assessment & Plan Note (Signed)
Difficult problem as stimulant may be exacerbating problem.  Will continue current treatment as this has taken several years for him and long term provider to work through

## 2020-08-30 NOTE — Progress Notes (Signed)
BP 125/70   Pulse (!) 41   Temp (!) 97.5 F (36.4 C)   Wt 165 lb 3.2 oz (74.9 kg)   SpO2 99%   BMI 26.66 kg/m    Subjective:    Patient ID: Dillon Sosa, male    DOB: [birth year], 24 y.o.   MRN: [id number]  HPI: Dillon Sosa is a 24 y.o. male  Chief Complaint  Patient presents with  . ADHD  . Insomnia  . Numbness    Pt states in the middle of his back, between his shoulder blades he feels numbness and tingling    ADHD.   3 month f/u for ADHD.  Pt  Taking Adderall since second grade.  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  Completed pre requisites and starting nursing classes.  Pt states that Adderall keeps him focused and can't sit still.  Teachers ask him to take his medications.    Insomnia States insomnia was ongoing before and after being on Adderall.  Takes Trazadone with Ambien "as needed" which is once or twice a week.    Relevant past medical, surgical, family and social history reviewed and updated as indicated. Interim medical history since our last visit reviewed. Allergies and medications reviewed and updated.  Review of Systems  Neurological:       Works as a Psychologist, occupational.  Numbness comes and goes    Per HPI unless specifically indicated above     Objective:    BP 125/70   Pulse (!) 41   Temp (!) 97.5 F (36.4 C)   Wt 165 lb 3.2 oz (74.9 kg)   SpO2 99%   BMI 26.66 kg/m   Wt Readings from Last 3 Encounters:  08/30/20 165 lb 3.2 oz (74.9 kg)  06/07/20 167 lb (75.8 kg)  03/01/20 165 lb (74.8 kg)    Physical Exam Constitutional:      General: He is not in acute distress.    Appearance: Normal appearance. He is well-developed and well-nourished.  HENT:     Head: Normocephalic and atraumatic.  Eyes:     General: Lids are normal. No scleral icterus.       Right eye: No discharge.        Left eye: No discharge.     Conjunctiva/sclera: Conjunctivae normal.  Neck:     Vascular: No carotid bruit or JVD.  Cardiovascular:      Rate and Rhythm: Normal rate and regular rhythm.     Heart sounds: Normal heart sounds.  Pulmonary:     Effort: Pulmonary effort is normal. No respiratory distress.     Breath sounds: Normal breath sounds.  Abdominal:     Palpations: There is no hepatomegaly or splenomegaly.  Musculoskeletal:        General: Normal range of motion.     Cervical back: Normal range of motion and neck supple.  Skin:    General: Skin is warm, dry and intact.     Coloration: Skin is not pale.     Findings: No rash.  Neurological:     Mental Status: He is alert and oriented to person, place, and time.  Psychiatric:        Mood and Affect: Mood and affect normal.        Behavior: Behavior normal.        Thought Content: Thought content normal.        Judgment: Judgment normal.       Assessment &  Plan:   Problem List Items Addressed This Visit      Unprioritized   ADHD    This is stable on current and long term medications.       Insomnia    Difficult problem as stimulant may be exacerbating problem.  Will continue current treatment as this has taken several years for him and long term provider to work through       Other Visit Diagnoses    Numbness    -  Primary   suspect nerve irritation       Follow up plan: Return in about 3 months (around 11/28/2020).

## 2020-08-30 NOTE — Assessment & Plan Note (Signed)
This is stable on current and long term medications.

## 2020-11-29 ENCOUNTER — Ambulatory Visit: Payer: PRIVATE HEALTH INSURANCE | Admitting: Nurse Practitioner

## 2020-11-29 ENCOUNTER — Other Ambulatory Visit: Payer: Self-pay

## 2020-11-29 ENCOUNTER — Encounter: Payer: Self-pay | Admitting: Nurse Practitioner

## 2020-11-29 VITALS — BP 106/60 | HR 45 | Temp 98.7°F | Wt 164.4 lb

## 2020-11-29 DIAGNOSIS — R001 Bradycardia, unspecified: Secondary | ICD-10-CM

## 2020-11-29 DIAGNOSIS — R9431 Abnormal electrocardiogram [ECG] [EKG]: Secondary | ICD-10-CM

## 2020-11-29 DIAGNOSIS — F909 Attention-deficit hyperactivity disorder, unspecified type: Secondary | ICD-10-CM

## 2020-11-29 DIAGNOSIS — M25571 Pain in right ankle and joints of right foot: Secondary | ICD-10-CM

## 2020-11-29 DIAGNOSIS — G4700 Insomnia, unspecified: Secondary | ICD-10-CM

## 2020-11-29 NOTE — Assessment & Plan Note (Deleted)
Obtained controlled substance agreement to continue Adderall.  UDS obtained during visit today.  Return in 3 months.

## 2020-11-29 NOTE — Progress Notes (Signed)
BP 106/60   Pulse (!) 45   Temp 98.7 F (37.1 C)   Wt 164 lb 6.4 oz (74.6 kg)   SpO2 96%   BMI 26.53 kg/m    Subjective:    Patient ID: Dillon Sosa, male    DOB: Jul 31, 1996, 25 y.o.   MRN: [id number]  HPI: Dillon Sosa is a 25 y.o. male  Chief Complaint  Patient presents with  . ADHD  . Insomnia  . Ankle Pain    Patient states right ankle has been hurting about 2-3 weeks. Injured his ankle about years 3 ago and is hurting in the same spot where he hurt it.   . Bradycardia    Patient states he has noticed that he has always had a slow heart rate and is concerned whether he should do something about it.    ADHD FOLLOW UP Patient here today for ADHD follow up.  He hasn't taken his Adderall in a few days because he occasionally likes to "take a break from medication".  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  This dose is working well for him.   INSOMNIA Patient states his current dose of Trazadone and Ambien are working well for him.  He has not obtained that sleep study that was recommended to him due to being in nursing school, having little time, and the cost of the test. Patient states he goes through cycles with his sleep where he is able to sleep well and then other's when he struggles to fall asleep without the Ambien.    ANKLE PAIN Patient has old ankle injury that flared about 3 weeks ago.  Patient states he has slight pain near the lateral malleolus.  Denies swelling, bruising or weakness.  The pain does not keep him from working or doing his clinicals.  He is afraid that he may injure it more by not knowing what is going on.  He is concerned that his insurance won't cover the cost of the test.  I explained that an xray would be needed first but patient would rather have a test that looks at "tissue" instead.   LOW HEART RATE Patient states his heart rate has been low for awhile.  He does not have any SOB, HA, dizziness,  feelings of passing out.    Patient has been micro dosing Psilocybin via home grown mushrooms.  He has not been taking any of his medications currently.   Relevant past medical, surgical, family and social history reviewed and updated as indicated. Interim medical history since our last visit reviewed. Allergies and medications reviewed and updated.  Review of Systems  Respiratory: Negative for shortness of breath.   Cardiovascular: Negative for chest pain and palpitations.       Low heart rate.  Musculoskeletal:       Right ankle pain  Neurological: Negative for dizziness and light-headedness.  Psychiatric/Behavioral: Positive for decreased concentration and sleep disturbance.    Per HPI unless specifically indicated above     Objective:    BP 106/60   Pulse (!) 45   Temp 98.7 F (37.1 C)   Wt 164 lb 6.4 oz (74.6 kg)   SpO2 96%   BMI 26.53 kg/m   Wt Readings from Last 3 Encounters:  11/29/20 164 lb 6.4 oz (74.6 kg)  08/30/20 165 lb 3.2 oz (74.9 kg)  06/07/20 167 lb (75.8 kg)    Physical Exam Vitals and nursing note reviewed.  Constitutional:      General: He is not in acute distress.    Appearance: Normal appearance. He is not ill-appearing, toxic-appearing or diaphoretic.  HENT:     Head: Normocephalic.     Right Ear: External ear normal.     Left Ear: External ear normal.     Nose: Nose normal. No congestion or rhinorrhea.     Mouth/Throat:     Mouth: Mucous membranes are moist.  Eyes:     General:        Right eye: No discharge.        Left eye: No discharge.     Extraocular Movements: Extraocular movements intact.     Conjunctiva/sclera: Conjunctivae normal.     Pupils: Pupils are equal, round, and reactive to light.  Cardiovascular:     Rate and Rhythm: Normal rate. Rhythm irregular.     Heart sounds: No murmur heard.   Pulmonary:     Effort: Pulmonary effort is normal. No respiratory distress.     Breath sounds: Normal breath sounds. No wheezing,  rhonchi or rales.  Abdominal:     General: Abdomen is flat. Bowel sounds are normal.  Musculoskeletal:     Cervical back: Normal range of motion and neck supple.  Skin:    General: Skin is warm and dry.     Capillary Refill: Capillary refill takes less than 2 seconds.  Neurological:     General: No focal deficit present.     Mental Status: He is alert and oriented to person, place, and time.  Psychiatric:        Mood and Affect: Mood normal.        Behavior: Behavior normal.        Thought Content: Thought content normal.        Judgment: Judgment normal.     Results for orders placed or performed in visit on 12/29/19  QuantiFERON-TB Gold Plus  Result Value Ref Range   QuantiFERON Incubation Incubation performed.    QuantiFERON Criteria Comment    QuantiFERON TB1 Ag Value 0.10 IU/mL   QuantiFERON TB2 Ag Value 0.10 IU/mL   QuantiFERON Nil Value 0.10 IU/mL   QuantiFERON Mitogen Value >10.00 IU/mL   QuantiFERON-TB Gold Plus Negative Negative  CBC with Differential/Platelet  Result Value Ref Range   WBC 4.5 3.4 - 10.8 x10E3/uL   RBC 4.68 4.14 - 5.80 x10E6/uL   Hemoglobin 14.4 13.0 - 17.7 g/dL   Hematocrit 02.6 37.8 - 51.0 %   MCV 89 79 - 97 fL   MCH 30.8 26.6 - 33.0 pg   MCHC 34.7 31.5 - 35.7 g/dL   RDW 58.8 50.2 - 77.4 %   Platelets 183 150 - 450 x10E3/uL   Neutrophils 45 Not Estab. %   Lymphs 43 Not Estab. %   Monocytes 10 Not Estab. %   Eos 1 Not Estab. %   Basos 1 Not Estab. %   Neutrophils Absolute 2.0 1.4 - 7.0 x10E3/uL   Lymphocytes Absolute 2.0 0.7 - 3.1 x10E3/uL   Monocytes Absolute 0.4 0.1 - 0.9 x10E3/uL   EOS (ABSOLUTE) 0.1 0.0 - 0.4 x10E3/uL   Basophils Absolute 0.0 0.0 - 0.2 x10E3/uL   Immature Granulocytes 0 Not Estab. %   Immature Grans (Abs) 0.0 0.0 - 0.1 x10E3/uL  Comprehensive metabolic panel  Result Value Ref Range   Glucose 85 65 - 99 mg/dL   BUN 12 6 - 20 mg/dL   Creatinine, Ser 1.28 0.76 - 1.27 mg/dL  GFR calc non Af Amer 119 >59 mL/min/1.73    GFR calc Af Amer 138 >59 mL/min/1.73   BUN/Creatinine Ratio 13 9 - 20   Sodium 139 134 - 144 mmol/L   Potassium 4.1 3.5 - 5.2 mmol/L   Chloride 103 96 - 106 mmol/L   CO2 23 20 - 29 mmol/L   Calcium 9.6 8.7 - 10.2 mg/dL   Total Protein 7.4 6.0 - 8.5 g/dL   Albumin 4.9 4.1 - 5.2 g/dL   Globulin, Total 2.5 1.5 - 4.5 g/dL   Albumin/Globulin Ratio 2.0 1.2 - 2.2   Bilirubin Total 0.4 0.0 - 1.2 mg/dL   Alkaline Phosphatase 70 39 - 117 IU/L   AST 29 0 - 40 IU/L   ALT 33 0 - 44 IU/L  Lipid Panel w/o Chol/HDL Ratio  Result Value Ref Range   Cholesterol, Total 153 100 - 199 mg/dL   Triglycerides 932 0 - 149 mg/dL   HDL 59 >35 mg/dL   VLDL Cholesterol Cal 22 5 - 40 mg/dL   LDL Chol Calc (NIH) 72 0 - 99 mg/dL  TSH  Result Value Ref Range   TSH 1.060 0.450 - 4.500 uIU/mL  UA/M w/rflx Culture, Routine   Specimen: Urine   URINE  Result Value Ref Range   Specific Gravity, UA 1.015 1.005 - 1.030   pH, UA 6.0 5.0 - 7.5   Color, UA Yellow Yellow   Appearance Ur Clear Clear   Leukocytes,UA Negative Negative   Protein,UA Negative Negative/Trace   Glucose, UA Negative Negative   Ketones, UA 1+ (A) Negative   RBC, UA Negative Negative   Bilirubin, UA Negative Negative   Urobilinogen, Ur 0.2 0.2 - 1.0 mg/dL   Nitrite, UA Negative Negative      Assessment & Plan:   Problem List Items Addressed This Visit      Other   ADHD - Primary    Chronic.  Controlled.  Discussed controlled substance agreement and UDS will be needed to continue Adderall.  Discussed that sleep disturbance is likely related to Stimulant use.  Gave patient the option to decrease the dose or try sustained release to help with sleep.  Patient would like to continue with current dose of Adderall.  Refill will be due 12/18/2020.  Will send today to start on 12/18/20..  Follow up in 3 months.      Insomnia    Chronic.  Discussed with patient that unable to give patient 2 controlled substances.  Patient chose to continue with  Adderall vs. Ambien.  Recommend using Trazadone for sleep.  Discussed that sleep disturbance is likely related to use of stimulant. If symptoms remain uncontrolled can consider referral for sleep study.       Controlled substance agreement signed    Obtained controlled substance agreement to continue Adderall.  UDS obtained during visit today.  Return in 3 months.           Follow up plan: No follow-ups on file.

## 2020-11-29 NOTE — Assessment & Plan Note (Addendum)
Chronic.  Patient has not been taking his medication recently.  Refills not provided today as a discussion took place that he will need to choose between Adderrall and Ambien due to not being recommend that he be prescribed two controlled substances.  Patient also had EKG changes and a referral to Cardiology was placed.  Discussed with patient that we can revisit restarting medications once he is evaluated by cardiology.

## 2020-11-29 NOTE — Assessment & Plan Note (Addendum)
Chronic.  Medications not refilled at this time.  Patient is not currently taking the medications and I do not recommend he take them until he is evaluated by Cardiology.  Discussed with patient that long term sleep aids are not recommended for patient's.  Sleep study is recommended by sleep medicine MD.  Will reassess at patient's follow up.

## 2020-12-06 ENCOUNTER — Ambulatory Visit (INDEPENDENT_AMBULATORY_CARE_PROVIDER_SITE_OTHER): Payer: No Typology Code available for payment source | Admitting: Internal Medicine

## 2020-12-06 ENCOUNTER — Encounter: Payer: Self-pay | Admitting: Internal Medicine

## 2020-12-06 ENCOUNTER — Other Ambulatory Visit: Payer: Self-pay

## 2020-12-06 VITALS — BP 134/64 | HR 52 | Ht 66.0 in | Wt 161.2 lb

## 2020-12-06 DIAGNOSIS — R001 Bradycardia, unspecified: Secondary | ICD-10-CM | POA: Diagnosis not present

## 2020-12-06 NOTE — Patient Instructions (Signed)
Medication Instructions:  Your physician recommends that you continue on your current medications as directed. Please refer to the Current Medication list given to you today.  *If you need a refill on your cardiac medications before your next appointment, please call your pharmacy*   Lab Work: None ordered If you have labs (blood work) drawn today and your tests are completely normal, you will receive your results only by: . MyChart Message (if you have MyChart) OR . A paper copy in the mail If you have any lab test that is abnormal or we need to change your treatment, we will call you to review the results.   Testing/Procedures: None ordered   Follow-Up: At [facility], you and your health needs are our priority.  As part of our continuing mission to provide you with exceptional heart care, we have created designated Provider Care Teams.  These Care Teams include your primary Cardiologist (physician) and Advanced Practice Providers (APPs -  Physician Assistants and Nurse Practitioners) who all work together to provide you with the care you need, when you need it.  We recommend signing up for the patient portal called "MyChart".  Sign up information is provided on this After Visit Summary.  MyChart is used to connect with patients for Virtual Visits (Telemedicine).  Patients are able to view lab/test results, encounter notes, upcoming appointments, etc.  Non-urgent messages can be sent to your provider as well.   To learn more about what you can do with MyChart, go to [email or web address].    Your next appointment:   As needed  The format for your next appointment:   In Person  Provider:   You may see Christopher End, MD or one of the following Advanced Practice Providers on your designated Care Team:    Christopher Berge, NP  Ryan Dunn, PA-C  Jacquelyn Visser, PA-C  Cadence Furth, PA-C  Caitlin Walker, NP    Other Instructions N/A  

## 2020-12-06 NOTE — Progress Notes (Signed)
New Outpatient Visit Date: 12/06/2020  Referring Provider: Larae Grooms, NP [address] [place],  [place] [address]  Chief Complaint: Low heart rate and abnormal EKG  HPI:  Dillon Sosa is a 25 y.o. male who is being seen today for the evaluation of abnormal EKG at the request of Ms. Holdsworth. He has a history of ADHD and insomnia.  EKG was performed last week by Ms. Holdsworth with concern for T wave abnormalities.  Dillon Sosa reports a long history of bradycardia the he has noticed at home as well as when seeing other providers.  He is very active and does at least 250 minutes of cardio exercise per week.  He denies chest pain, shortness of breath, palpitations, and edema.  He has transient orthostatic lightheadedness when he stands up quickly from pending over.  He has monitored his heart rate on his watch and has notices that his resting heart rate will dip into the 30's (he is asymptomatic).  He is able to increase his heart rate up to 188 bpm when doing sprint exercises.  Dillon Sosa denies a history of cardiac disease and testing.  He reports smoking intermittently, though he has smoked more in the past.  He also used cocaine up until ~2 years ago.  --------------------------------------------------------------------------------------------------  Cardiovascular History & Procedures: Cardiovascular Problems:  Bradycardia  Risk Factors:  Prior polysubstance abuse and male gender  Cath/PCI:  None  CV Surgery:  None  EP Procedures and Devices:  None  Non-Invasive Evaluation(s):  None  Recent CV Pertinent Labs: Lab Results  Component Value Date   CHOL 153 12/29/2019   HDL 59 12/29/2019   LDLCALC 72 12/29/2019   TRIG 125 12/29/2019   K 4.1 12/29/2019   BUN 12 12/29/2019   CREATININE 0.90 12/29/2019    --------------------------------------------------------------------------------------------------  Past Medical History:  Diagnosis Date  .  ADHD   . Insomnia     Past Surgical History:  Procedure Laterality Date  . FOOT SURGERY Left 2016    Current Meds  Medication Sig  . Multiple Vitamins-Minerals (MULTIVITAMIN WITH MINERALS) tablet Take 1 tablet by mouth daily.  . Probiotic Product (PROBIOTIC-10 PO) Take 1 tablet by mouth daily.    Allergies: Patient has no known allergies.  Social History   Tobacco Use  . Smoking status: Former Smoker    Years: 2.00    Types: Cigarettes    Quit date: 07/2020    Years since quitting: 0.3  . Smokeless tobacco: Former Neurosurgeon    Types: Chew    Quit date: 10/15/2017  Vaping Use  . Vaping Use: Never used  Substance Use Topics  . Alcohol use: Yes    Comment: rarely  . Drug use: Not Currently    Types: Cocaine    Family History  Problem Relation Age of Onset  . Breast cancer Mother   . Other Mother        Low heart rate  . Insomnia Father   . Heart attack Maternal Grandmother     Review of Systems: A 12-system review of systems was performed and was negative except as noted in the HPI.  --------------------------------------------------------------------------------------------------  Physical Exam: BP 134/64 (BP Location: Right Arm, Patient Position: Sitting, Cuff Size: Normal)   Pulse (!) 52   Ht 5\' 6"  (1.676 m)   Wt 161 lb 4 oz (73.1 kg)   SpO2 99%   BMI 26.03 kg/m   General:  NAD.  Accompanied by his girlfriend. HEENT: No conjunctival pallor or  scleral icterus. Facemask in place. Neck: Supple without lymphadenopathy, thyromegaly, JVD, or HJR. No carotid bruit. Lungs: Normal work of breathing. Clear to auscultation bilaterally without wheezes or crackles. Heart: Bradycardic but regular rate and rhythm without murmurs, rubs, or gallops. Non-displaced PMI. Abd: Bowel sounds present. Soft, NT/ND without hepatosplenomegaly Ext: No lower extremity edema. Radial, PT, and DP pulses are 2+ bilaterally Skin: Warm and dry without rash. Neuro: Nonfocal. Psych: Normal  mood and affect.  EKG:  Sinus bradycardia without abnormality.  Lab Results  Component Value Date   WBC 4.5 12/29/2019   HGB 14.4 12/29/2019   HCT 41.5 12/29/2019   MCV 89 12/29/2019   PLT 183 12/29/2019    Lab Results  Component Value Date   NA 139 12/29/2019   K 4.1 12/29/2019   CL 103 12/29/2019   CO2 23 12/29/2019   BUN 12 12/29/2019   CREATININE 0.90 12/29/2019   GLUCOSE 85 12/29/2019   ALT 33 12/29/2019    Lab Results  Component Value Date   CHOL 153 12/29/2019   HDL 59 12/29/2019   LDLCALC 72 12/29/2019   TRIG 125 12/29/2019     --------------------------------------------------------------------------------------------------  ASSESSMENT AND PLAN: Sinus bradycardia: This has been longstanding and is without symptoms.  EKG today shows sinus bradycardia without significant abnormality.  ST/T changes that Dillon Sosa was concerned about on recent EKG with his PCP represent mild early repolarization in V2, which is a normal finding and is similar to prior EKG in 2012.  As he is able to exercise and increase heart rate up to 188 bpm, I seen no evidence of chronotropic incompetence.  We discussed utility of ambulatory cardiac monitoring but have agreed to defer this as his low resting heart rate is likely reflective of good cardiovascular conditioning and not an arrhythmia.  I think it would be reasonable to check electrolytes and TSH; Dillon Sosa wishes to have this done with his PCP at his wellness visit next month.  Unless symptoms develop, I do not recommend any further cardiac testing.  Tobacco cessation and continued avoidance of cocaine were recommended.  Follow-up: Return to clinic as needed.  Yvonne Kendall, MD 12/06/2020 10:44 PM

## 2020-12-14 ENCOUNTER — Ambulatory Visit: Payer: No Typology Code available for payment source

## 2021-01-09 NOTE — Progress Notes (Signed)
BP (!) 104/57   Pulse (!) 42   Temp (!) 97.2 F (36.2 C)   Ht 5' 7.44" (1.713 m)   Wt 164 lb 6 oz (74.6 kg)   SpO2 100%   BMI 25.41 kg/m    Subjective:    Patient ID: Dillon Sosa, male    DOB: 1995-10-19, 25 y.o.   MRN: [id number]  HPI: Dillon Sosa is a 25 y.o. male presenting on 01/10/2021 for comprehensive medical examination. Current medical complaints include: Insomnia and ADHD  He currently lives with: Girlfriend and Dog Interim Problems from his last visit: no  Patient recently saw Cardiology after having a slightly abnormal EKG in office.  He does have sinus brady which Cardiology contributes to being in good physical health.  INSOMNIA Patient states he is still struggling with sleep.  Patient states it is most nights.  He sometimes doesn't fall asleep until 2-3.  He has been going to bed at 9.  He would like to restart the Ambien.   ADHD Patient states he does not take the medication every day. He does not like how it makes him feel.  His teachers have noticed in the last month that he has not been on the medication and he is wondering in class.  He does not plan to continue to medication after school ends.   Depression Screen done today and results listed below:  Depression screen [facility] 2/9 01/10/2021 12/29/2019 06/09/2019 04/29/2018  Decreased Interest 0 0 0 0  Down, Depressed, Hopeless 0 0 0 0  PHQ - 2 Score 0 0 0 0  Altered sleeping - 1 2 3   Tired, decreased energy - 0 0 0  Change in appetite - 0 0 0  Feeling bad or failure about yourself  - 1 0 0  Trouble concentrating - 1 0 1  Moving slowly or fidgety/restless - 0 1 0  Suicidal thoughts - 0 0 0  PHQ-9 Score - 3 3 4   Difficult doing work/chores - - Not difficult at all -    The patient does not have a history of falls. I did not complete a risk assessment for falls. A plan of care for falls was not documented.   Past Medical History:  Past Medical History:  Diagnosis Date  . ADHD   .  Insomnia     Surgical History:  Past Surgical History:  Procedure Laterality Date  . FOOT SURGERY Left 2016    Medications:  Current Outpatient Medications on File Prior to Visit  Medication Sig  . Multiple Vitamins-Minerals (MULTIVITAMIN WITH MINERALS) tablet Take 1 tablet by mouth daily.  . Probiotic Product (PROBIOTIC-10 PO) Take 1 tablet by mouth daily.   No current facility-administered medications on file prior to visit.    Allergies:  No Known Allergies  Social History:  Social History   Socioeconomic History  . Marital status: Single    Spouse name: Not on file  . Number of children: Not on file  . Years of education: Not on file  . Highest education level: Not on file  Occupational History  . Not on file  Tobacco Use  . Smoking status: Former Smoker    Years: 2.00    Types: Cigarettes    Quit date: 07/2020    Years since quitting: 0.4  . Smokeless tobacco: Former 2017    Types: Chew    Quit date: 10/15/2017  Vaping Use  . Vaping Use: Never used  Substance and Sexual Activity  .  Alcohol use: Yes    Comment: rarely  . Drug use: Not Currently    Types: Cocaine  . Sexual activity: Yes  Other Topics Concern  . Not on file  Social History Narrative  . Not on file   Social Determinants of Health   Financial Resource Strain: Not on file  Food Insecurity: Not on file  Transportation Needs: Not on file  Physical Activity: Not on file  Stress: Not on file  Social Connections: Not on file  Intimate Partner Violence: Not on file   Social History   Tobacco Use  Smoking Status Former Smoker  . Years: 2.00  . Types: Cigarettes  . Quit date: 07/2020  . Years since quitting: 0.4  Smokeless Tobacco Former Neurosurgeon  . Types: Chew  . Quit date: 10/15/2017   Social History   Substance and Sexual Activity  Alcohol Use Yes   Comment: rarely    Family History:  Family History  Problem Relation Age of Onset  . Breast cancer Mother   . Other Mother         Low heart rate  . Insomnia Father   . Heart attack Maternal Grandmother     Past medical history, surgical history, medications, allergies, family history and social history reviewed with patient today and changes made to appropriate areas of the chart.   Review of Systems  Eyes: Negative for blurred vision and double vision.  Respiratory: Negative for shortness of breath.   Cardiovascular: Negative for chest pain, palpitations and leg swelling.  Neurological: Negative for dizziness and headaches.  Psychiatric/Behavioral: The patient has insomnia.        Decreased concentration   All other ROS negative except what is listed above and in the HPI.      Objective:    BP (!) 104/57   Pulse (!) 42   Temp (!) 97.2 F (36.2 C)   Ht 5' 7.44" (1.713 m)   Wt 164 lb 6 oz (74.6 kg)   SpO2 100%   BMI 25.41 kg/m   Wt Readings from Last 3 Encounters:  01/10/21 164 lb 6 oz (74.6 kg)  12/06/20 161 lb 4 oz (73.1 kg)  11/29/20 164 lb 6.4 oz (74.6 kg)    Physical Exam Vitals and nursing note reviewed.  Constitutional:      General: He is not in acute distress.    Appearance: Normal appearance. He is not ill-appearing, toxic-appearing or diaphoretic.  HENT:     Head: Normocephalic.     Right Ear: Tympanic membrane, ear canal and external ear normal.     Left Ear: Tympanic membrane, ear canal and external ear normal.     Nose: Nose normal. No congestion or rhinorrhea.     Mouth/Throat:     Mouth: Mucous membranes are moist.  Eyes:     General:        Right eye: No discharge.        Left eye: No discharge.     Extraocular Movements: Extraocular movements intact.     Conjunctiva/sclera: Conjunctivae normal.     Pupils: Pupils are equal, round, and reactive to light.  Cardiovascular:     Rate and Rhythm: Normal rate and regular rhythm.     Heart sounds: No murmur heard.   Pulmonary:     Effort: Pulmonary effort is normal. No respiratory distress.     Breath sounds: Normal breath  sounds. No wheezing, rhonchi or rales.  Abdominal:     General: Abdomen is  flat. Bowel sounds are normal. There is no distension.     Palpations: Abdomen is soft.     Tenderness: There is no abdominal tenderness. There is no guarding.  Musculoskeletal:     Cervical back: Normal range of motion and neck supple.  Skin:    General: Skin is warm and dry.     Capillary Refill: Capillary refill takes less than 2 seconds.  Neurological:     General: No focal deficit present.     Mental Status: He is alert and oriented to person, place, and time.     Cranial Nerves: No cranial nerve deficit.     Motor: No weakness.     Deep Tendon Reflexes: Reflexes normal.  Psychiatric:        Mood and Affect: Mood normal.        Behavior: Behavior normal.        Thought Content: Thought content normal.        Judgment: Judgment normal.     Results for orders placed or performed in visit on 12/29/19  QuantiFERON-TB Gold Plus  Result Value Ref Range   QuantiFERON Incubation Incubation performed.    QuantiFERON Criteria Comment    QuantiFERON TB1 Ag Value 0.10 IU/mL   QuantiFERON TB2 Ag Value 0.10 IU/mL   QuantiFERON Nil Value 0.10 IU/mL   QuantiFERON Mitogen Value >10.00 IU/mL   QuantiFERON-TB Gold Plus Negative Negative  CBC with Differential/Platelet  Result Value Ref Range   WBC 4.5 3.4 - 10.8 x10E3/uL   RBC 4.68 4.14 - 5.80 x10E6/uL   Hemoglobin 14.4 13.0 - 17.7 g/dL   Hematocrit 16.141.5 09.637.5 - 51.0 %   MCV 89 79 - 97 fL   MCH 30.8 26.6 - 33.0 pg   MCHC 34.7 31.5 - 35.7 g/dL   RDW 04.512.7 40.911.6 - 81.115.4 %   Platelets 183 150 - 450 x10E3/uL   Neutrophils 45 Not Estab. %   Lymphs 43 Not Estab. %   Monocytes 10 Not Estab. %   Eos 1 Not Estab. %   Basos 1 Not Estab. %   Neutrophils Absolute 2.0 1.4 - 7.0 x10E3/uL   Lymphocytes Absolute 2.0 0.7 - 3.1 x10E3/uL   Monocytes Absolute 0.4 0.1 - 0.9 x10E3/uL   EOS (ABSOLUTE) 0.1 0.0 - 0.4 x10E3/uL   Basophils Absolute 0.0 0.0 - 0.2 x10E3/uL   Immature  Granulocytes 0 Not Estab. %   Immature Grans (Abs) 0.0 0.0 - 0.1 x10E3/uL  Comprehensive metabolic panel  Result Value Ref Range   Glucose 85 65 - 99 mg/dL   BUN 12 6 - 20 mg/dL   Creatinine, Ser 9.140.90 0.76 - 1.27 mg/dL   GFR calc non Af Amer 119 >59 mL/min/1.73   GFR calc Af Amer 138 >59 mL/min/1.73   BUN/Creatinine Ratio 13 9 - 20   Sodium 139 134 - 144 mmol/L   Potassium 4.1 3.5 - 5.2 mmol/L   Chloride 103 96 - 106 mmol/L   CO2 23 20 - 29 mmol/L   Calcium 9.6 8.7 - 10.2 mg/dL   Total Protein 7.4 6.0 - 8.5 g/dL   Albumin 4.9 4.1 - 5.2 g/dL   Globulin, Total 2.5 1.5 - 4.5 g/dL   Albumin/Globulin Ratio 2.0 1.2 - 2.2   Bilirubin Total 0.4 0.0 - 1.2 mg/dL   Alkaline Phosphatase 70 39 - 117 IU/L   AST 29 0 - 40 IU/L   ALT 33 0 - 44 IU/L  Lipid Panel w/o Chol/HDL Ratio  Result Value  Ref Range   Cholesterol, Total 153 100 - 199 mg/dL   Triglycerides 263 0 - 149 mg/dL   HDL 59 >33 mg/dL   VLDL Cholesterol Cal 22 5 - 40 mg/dL   LDL Chol Calc (NIH) 72 0 - 99 mg/dL  TSH  Result Value Ref Range   TSH 1.060 0.450 - 4.500 uIU/mL  UA/M w/rflx Culture, Routine   Specimen: Urine   URINE  Result Value Ref Range   Specific Gravity, UA 1.015 1.005 - 1.030   pH, UA 6.0 5.0 - 7.5   Color, UA Yellow Yellow   Appearance Ur Clear Clear   Leukocytes,UA Negative Negative   Protein,UA Negative Negative/Trace   Glucose, UA Negative Negative   Ketones, UA 1+ (A) Negative   RBC, UA Negative Negative   Bilirubin, UA Negative Negative   Urobilinogen, Ur 0.2 0.2 - 1.0 mg/dL   Nitrite, UA Negative Negative      Assessment & Plan:   Problem List Items Addressed This Visit      Other   ADHD    Exacerbated due to not having his medication for the last month.  Agreed to give patient Adderall until he finishes school next years. Controlled substance agreement and UDS obtained today.       Insomnia    Exacerbated without medication. Patient understands the risks of taking hypnotic medication and  okay to continue.  Controlled substance agreement and UDS obtained.       Controlled substance agreement signed   Relevant Orders   [id number] 11+Oxyco+Alc+Crt-Bund    Other Visit Diagnoses    Annual physical exam    -  Primary   Health mainteance reviewed. HPV vaccine given today.  Labs ordered today.    Relevant Orders   TSH   Lipid panel   CBC with Differential/Platelet   Comprehensive metabolic panel   Urinalysis, Routine w reflex microscopic   Bradycardia       Stable. Has seen Cardiology who believes it is due to being physically active.   Relevant Orders   TSH   Lipid panel   CBC with Differential/Platelet   Comprehensive metabolic panel   Urinalysis, Routine w reflex microscopic   Immunization due       Relevant Orders   HPV 9-valent vaccine,Recombinat (Completed)       Discussed aspirin prophylaxis for myocardial infarction prevention and decision was that we recommended ASA, and patient refused  LABORATORY TESTING:  Health maintenance labs ordered today as discussed above.    IMMUNIZATIONS:   - Tdap: Tetanus vaccination status reviewed: last tetanus booster within 10 years. - Influenza: Postponed to flu season - Pneumovax: Not applicable - Prevnar: Not applicable - HPV: Given elsewhere - Zostavax vaccine: Not applicable  SCREENING: - Colonoscopy: Not applicable  Discussed with patient purpose of the colonoscopy is to detect colon cancer at curable precancerous or early stages   - AAA Screening: Not applicable  -Hearing Test: Not applicable  -Spirometry: Not applicable   PATIENT COUNSELING:    Sexuality: Discussed sexually transmitted diseases, partner selection, use of condoms, avoidance of unintended pregnancy  and contraceptive alternatives.   Advised to avoid cigarette smoking.  I discussed with the patient that most people either abstain from alcohol or drink within safe limits (<=14/week and <=4 drinks/occasion for males, <=7/weeks and <= 3  drinks/occasion for females) and that the risk for alcohol disorders and other health effects rises proportionally with the number of drinks per week and how often a drinker  exceeds daily limits.  Discussed cessation/primary prevention of drug use and availability of treatment for abuse.   Diet: Encouraged to adjust caloric intake to maintain  or achieve ideal body weight, to reduce intake of dietary saturated fat and total fat, to limit sodium intake by avoiding high sodium foods and not adding table salt, and to maintain adequate dietary potassium and calcium preferably from fresh fruits, vegetables, and low-fat dairy products.    stressed the importance of regular exercise  Injury prevention: Discussed safety belts, safety helmets, smoke detector, smoking near bedding or upholstery.   Dental health: Discussed importance of regular tooth brushing, flossing, and dental visits.   Follow up plan: NEXT PREVENTATIVE PHYSICAL DUE IN 1 YEAR. Return in about 2 months (around 03/12/2021) for ADHD FU and 2nd HPV vaccine.

## 2021-01-10 ENCOUNTER — Ambulatory Visit (INDEPENDENT_AMBULATORY_CARE_PROVIDER_SITE_OTHER): Payer: PRIVATE HEALTH INSURANCE | Admitting: Nurse Practitioner

## 2021-01-10 ENCOUNTER — Encounter: Payer: Self-pay | Admitting: Nurse Practitioner

## 2021-01-10 ENCOUNTER — Other Ambulatory Visit: Payer: Self-pay

## 2021-01-10 VITALS — BP 104/57 | HR 42 | Temp 97.2°F | Ht 67.44 in | Wt 164.4 lb

## 2021-01-10 DIAGNOSIS — R001 Bradycardia, unspecified: Secondary | ICD-10-CM

## 2021-01-10 DIAGNOSIS — Z0000 Encounter for general adult medical examination without abnormal findings: Secondary | ICD-10-CM | POA: Diagnosis not present

## 2021-01-10 DIAGNOSIS — F909 Attention-deficit hyperactivity disorder, unspecified type: Secondary | ICD-10-CM | POA: Diagnosis not present

## 2021-01-10 DIAGNOSIS — Z79899 Other long term (current) drug therapy: Secondary | ICD-10-CM | POA: Diagnosis not present

## 2021-01-10 DIAGNOSIS — G4700 Insomnia, unspecified: Secondary | ICD-10-CM

## 2021-01-10 DIAGNOSIS — Z23 Encounter for immunization: Secondary | ICD-10-CM | POA: Diagnosis not present

## 2021-01-10 LAB — URINALYSIS, ROUTINE W REFLEX MICROSCOPIC
Bilirubin, UA: NEGATIVE
Glucose, UA: NEGATIVE
Ketones, UA: NEGATIVE
Leukocytes,UA: NEGATIVE
Nitrite, UA: NEGATIVE
Protein,UA: NEGATIVE
RBC, UA: NEGATIVE
Specific Gravity, UA: 1.01 (ref 1.005–1.030)
Urobilinogen, Ur: 0.2 mg/dL (ref 0.2–1.0)
pH, UA: 6 (ref 5.0–7.5)

## 2021-01-10 MED ORDER — ZOLPIDEM TARTRATE 5 MG PO TABS: 5.0000 mg | ORAL_TABLET | Freq: Every evening | ORAL | 0 refills | Status: DC | PRN

## 2021-01-10 MED ORDER — AMPHETAMINE-DEXTROAMPHET ER 20 MG PO CP24: 20.0000 mg | ORAL_CAPSULE | Freq: Every day | ORAL | 0 refills | Status: DC

## 2021-01-10 NOTE — Assessment & Plan Note (Signed)
Exacerbated due to not having his medication for the last month.  Agreed to give patient Adderall until he finishes school next years. Controlled substance agreement and UDS obtained today.

## 2021-01-10 NOTE — Progress Notes (Signed)
Hi Dillon Sosa.  It was good to see you today.  Your Urinalysis was normal.  I will send you another message once the rest of your lab work is back.

## 2021-01-10 NOTE — Assessment & Plan Note (Signed)
Exacerbated without medication. Patient understands the risks of taking hypnotic medication and okay to continue.  Controlled substance agreement and UDS obtained.

## 2021-01-10 NOTE — Patient Instructions (Signed)
HPV (Human Papillomavirus) Vaccine: What You Need to Know 1. Why get vaccinated? HPV (human papillomavirus) vaccine can prevent infection with some types of human papillomavirus. HPV infections can cause certain types of cancers, including:  cervical, vaginal, and vulvar cancers in women  penile cancer in men  anal cancers in both men and women  cancers of tonsils, base of tongue, and back of throat (oropharyngeal cancer) in both men and women HPV infections can also cause anogenital warts. HPV vaccine can prevent over 90% of cancers caused by HPV. HPV is spread through intimate skin-to-skin or sexual contact. HPV infections are so common that nearly all people will get at least one type of HPV at some time in their lives. Most HPV infections go away on their own within 2 years. But sometimes HPV infections will last longer and can cause cancers later in life. 2. HPV vaccine HPV vaccine is routinely recommended for adolescents at 11 or 26 years of age to ensure they are protected before they are exposed to the virus. HPV vaccine may be given beginning at age 9 years and vaccination is recommended for everyone through 26 years of age. HPV vaccine may be given to adults 27 through 26 years of age, based on discussions between the patient and health care provider. Most children who get the first dose before 15 years of age need 2 doses of HPV vaccine. People who get the first dose at or after 15 years of age and younger people with certain immunocompromising conditions need 3 doses. Your health care provider can give you more information. HPV vaccine may be given at the same time as other vaccines. 3. Talk with your health care provider Tell your vaccination provider if the person getting the vaccine:  Has had an allergic reaction after a previous dose of HPV vaccine, or has any severe, life-threatening allergies  Is pregnant--HPV vaccine is not recommended until after pregnancy In some cases,  your health care provider may decide to postpone HPV vaccination until a future visit. People with minor illnesses, such as a cold, may be vaccinated. People who are moderately or severely ill should usually wait until they recover before getting HPV vaccine. Your health care provider can give you more information. 4. Risks of a vaccine reaction  Soreness, redness, or swelling where the shot is given can happen after HPV vaccination.  Fever or headache can happen after HPV vaccination. People sometimes faint after medical procedures, including vaccination. Tell your provider if you feel dizzy or have vision changes or ringing in the ears. As with any medicine, there is a very remote chance of a vaccine causing a severe allergic reaction, other serious injury, or death. 5. What if there is a serious problem? An allergic reaction could occur after the vaccinated person leaves the clinic. If you see signs of a severe allergic reaction (hives, swelling of the face and throat, difficulty breathing, a fast heartbeat, dizziness, or weakness), call 9-1-1 and get the person to the nearest hospital. For other signs that concern you, call your health care provider. Adverse reactions should be reported to the Vaccine Adverse Event Reporting System (VAERS). Your health care provider will usually file this report, or you can do it yourself. Visit the VAERS website at www.vaers.hhs.gov or call 1-800-822-7967. VAERS is only for reporting reactions, and VAERS staff members do not give medical advice. 6. The National Vaccine Injury Compensation Program The National Vaccine Injury Compensation Program (VICP) is a federal program that was created   to compensate people who may have been injured by certain vaccines. Claims regarding alleged injury or death due to vaccination have a time limit for filing, which may be as short as two years. Visit the VICP website at www.hrsa.gov/vaccinecompensation or call 1-800-338-2382 to  learn about the program and about filing a claim. 7. How can I learn more?  Ask your health care provider.  Call your local or state health department.  Visit the website of the Food and Drug Administration (FDA) for vaccine package inserts and additional information at www.fda.gov/vaccines-blood-biologics/vaccines.  Contact the Centers for Disease Control and Prevention (CDC): ? Call 1-800-232-4636 (1-800-CDC-INFO) or ? Visit CDC's website at www.cdc.gov/vaccines. Vaccine Information Statement HPV Vaccine (04/19/2020) This information is not intended to replace advice given to you by your health care provider. Make sure you discuss any questions you have with your health care provider. Document Revised: 05/28/2020 Document Reviewed: 05/28/2020 Elsevier Patient Education  2021 Elsevier Inc.   

## 2021-01-11 LAB — DRUG SCREEN 764883 11+OXYCO+ALC+CRT-BUND
Amphetamines, Urine: NEGATIVE ng/mL
BENZODIAZ UR QL: NEGATIVE ng/mL
Barbiturate: NEGATIVE ng/mL
Cannabinoid Quant, Ur: NEGATIVE ng/mL
Cocaine (Metabolite): NEGATIVE ng/mL
Creatinine: 57.8 mg/dL (ref 20.0–300.0)
Ethanol: NEGATIVE %
Meperidine: NEGATIVE ng/mL
Methadone Screen, Urine: NEGATIVE ng/mL
OPIATE SCREEN URINE: NEGATIVE ng/mL
Oxycodone/Oxymorphone, Urine: NEGATIVE ng/mL
Phencyclidine: NEGATIVE ng/mL
Propoxyphene: NEGATIVE ng/mL
Tramadol: NEGATIVE ng/mL
pH, Urine: 5.7 (ref 4.5–8.9)

## 2021-01-11 LAB — CBC WITH DIFFERENTIAL/PLATELET
Basophils Absolute: 0 10*3/uL (ref 0.0–0.2)
Basos: 1 %
EOS (ABSOLUTE): 0 10*3/uL (ref 0.0–0.4)
Eos: 1 %
Hematocrit: 44.7 % (ref 37.5–51.0)
Hemoglobin: 15.3 g/dL (ref 13.0–17.7)
Immature Grans (Abs): 0 10*3/uL (ref 0.0–0.1)
Immature Granulocytes: 0 %
Lymphocytes Absolute: 2.3 10*3/uL (ref 0.7–3.1)
Lymphs: 49 %
MCH: 31 pg (ref 26.6–33.0)
MCHC: 34.2 g/dL (ref 31.5–35.7)
MCV: 91 fL (ref 79–97)
Monocytes Absolute: 0.5 10*3/uL (ref 0.1–0.9)
Monocytes: 10 %
Neutrophils Absolute: 1.8 10*3/uL (ref 1.4–7.0)
Neutrophils: 39 %
Platelets: 160 10*3/uL (ref 150–450)
RBC: 4.93 x10E6/uL (ref 4.14–5.80)
RDW: 12.9 % (ref 11.6–15.4)
WBC: 4.7 10*3/uL (ref 3.4–10.8)

## 2021-01-11 LAB — COMPREHENSIVE METABOLIC PANEL
ALT: 39 IU/L (ref 0–44)
AST: 35 IU/L (ref 0–40)
Albumin/Globulin Ratio: 2.3 — ABNORMAL HIGH (ref 1.2–2.2)
Albumin: 5.1 g/dL (ref 4.1–5.2)
Alkaline Phosphatase: 77 IU/L (ref 44–121)
BUN/Creatinine Ratio: 18 (ref 9–20)
BUN: 16 mg/dL (ref 6–20)
Bilirubin Total: 0.4 mg/dL (ref 0.0–1.2)
CO2: 24 mmol/L (ref 20–29)
Calcium: 9.8 mg/dL (ref 8.7–10.2)
Chloride: 101 mmol/L (ref 96–106)
Creatinine, Ser: 0.88 mg/dL (ref 0.76–1.27)
Globulin, Total: 2.2 g/dL (ref 1.5–4.5)
Glucose: 84 mg/dL (ref 65–99)
Potassium: 4.7 mmol/L (ref 3.5–5.2)
Sodium: 144 mmol/L (ref 134–144)
Total Protein: 7.3 g/dL (ref 6.0–8.5)
eGFR: 122 mL/min/{1.73_m2} (ref >59)

## 2021-01-11 LAB — LIPID PANEL
Chol/HDL Ratio: 2.5 ratio (ref 0.0–5.0)
Cholesterol, Total: 147 mg/dL (ref 100–199)
HDL: 58 mg/dL (ref >39)
LDL Chol Calc (NIH): 73 mg/dL (ref 0–99)
Triglycerides: 82 mg/dL (ref 0–149)
VLDL Cholesterol Cal: 16 mg/dL (ref 5–40)

## 2021-01-11 LAB — TSH: TSH: 1.59 u[IU]/mL (ref 0.450–4.500)

## 2021-01-13 NOTE — Progress Notes (Signed)
Hi Emmons, your Urine Drug Screen was normal.  See you at our next visit.

## 2021-01-17 ENCOUNTER — Ambulatory Visit
Admission: EM | Admit: 2021-01-17 | Discharge: 2021-01-17 | Disposition: A | Discharge status: Home / Self Care | Payer: PRIVATE HEALTH INSURANCE | Attending: Family Medicine | Admitting: Family Medicine

## 2021-01-17 ENCOUNTER — Other Ambulatory Visit: Payer: Self-pay

## 2021-01-17 DIAGNOSIS — Z111 Encounter for screening for respiratory tuberculosis: Secondary | ICD-10-CM

## 2021-01-17 MED ORDER — TUBERCULIN PPD 5 UNIT/0.1ML ID SOLN
5.0000 [IU] | Freq: Once | INTRADERMAL | Status: DC
  Administered 2021-01-17: 5 [IU] via INTRADERMAL

## 2021-01-17 NOTE — ED Triage Notes (Signed)
Patient is here for TB Skin Test Placement.

## 2021-01-19 ENCOUNTER — Ambulatory Visit
Admission: EM | Admit: 2021-01-19 | Discharge: 2021-01-19 | Disposition: A | Discharge status: Home / Self Care | Payer: No Typology Code available for payment source | Attending: Emergency Medicine | Admitting: Emergency Medicine

## 2021-01-19 ENCOUNTER — Other Ambulatory Visit: Payer: Self-pay

## 2021-01-19 DIAGNOSIS — Z111 Encounter for screening for respiratory tuberculosis: Secondary | ICD-10-CM

## 2021-01-19 NOTE — ED Triage Notes (Signed)
Pt here for PPD read.  Neg results

## 2021-03-03 ENCOUNTER — Other Ambulatory Visit: Payer: Self-pay | Admitting: Unknown Physician Specialty

## 2021-03-03 NOTE — Telephone Encounter (Signed)
}    Notes to clinic: review for continued use Looks like medication was d/c in error    Requested Prescriptions  Pending Prescriptions Disp Refills   traZODone (DESYREL) 150 MG tablet [Pharmacy Med Name: TRAZODONE 150 MG TABLET] 90 tablet 1    Sig: TAKE 1 TABLET BY MOUTH AT BEDTIME.      Psychiatry: Antidepressants - Serotonin Modulator Passed - 03/03/2021  1:38 AM      Passed - Valid encounter within last 6 months    Recent Outpatient Visits           1 month ago Annual physical exam   [facility] Larae Grooms, NP   3 months ago Attention deficit hyperactivity disorder (ADHD), unspecified ADHD type   [facility] Larae Grooms, NP   6 months ago Numbness   [facility] Gabriel Cirri, NP   8 months ago Attention deficit hyperactivity disorder (ADHD), unspecified ADHD type   [facility] Valentino Nose, NP   1 year ago Attention deficit hyperactivity disorder (ADHD), unspecified ADHD type   [facility] Particia Nearing, [place]       Future Appointments             In 2 weeks Larae Grooms, NP [facility]

## 2021-03-03 NOTE — Telephone Encounter (Signed)
Scheduled 7/6

## 2021-03-19 ENCOUNTER — Ambulatory Visit: Payer: PRIVATE HEALTH INSURANCE | Admitting: Nurse Practitioner

## 2021-03-19 ENCOUNTER — Other Ambulatory Visit: Payer: Self-pay

## 2021-03-19 ENCOUNTER — Encounter: Payer: Self-pay | Admitting: Nurse Practitioner

## 2021-03-19 VITALS — BP 118/70 | HR 42 | Temp 98.0°F | Wt 162.0 lb

## 2021-03-19 DIAGNOSIS — G4700 Insomnia, unspecified: Secondary | ICD-10-CM

## 2021-03-19 DIAGNOSIS — F909 Attention-deficit hyperactivity disorder, unspecified type: Secondary | ICD-10-CM

## 2021-03-19 DIAGNOSIS — Z23 Encounter for immunization: Secondary | ICD-10-CM | POA: Diagnosis not present

## 2021-03-19 MED ORDER — AMPHETAMINE-DEXTROAMPHET ER 20 MG PO CP24: 20.0000 mg | ORAL_CAPSULE | Freq: Every day | ORAL | 0 refills | Status: DC

## 2021-03-19 MED ORDER — ZOLPIDEM TARTRATE 5 MG PO TABS: 5.0000 mg | ORAL_TABLET | Freq: Every evening | ORAL | 0 refills | Status: DC | PRN

## 2021-03-19 NOTE — Progress Notes (Signed)
BP 118/70   Pulse (!) 42   Temp 98 F (36.7 C)   Wt 162 lb (73.5 kg)   SpO2 97%   BMI 25.04 kg/m    Subjective:    Patient ID: DUVID SMALLS, male    DOB: 1996/04/15, 25 y.o.   MRN: [id number]  HPI: Dillon Sosa is a 25 y.o. male  Chief Complaint  Patient presents with   ADHD   ADHD FOLLOW UP ADHD status: controlled Satisfied with current therapy: yes Medication compliance:  excellent compliance Controlled substance contract: yes Previous psychiatry evaluation: no Previous medications: no adderall   Taking meds on weekends/vacations: no Work/school performance:  good Difficulty sustaining attention/completing tasks: no Distracted by extraneous stimuli: no Does not listen when spoken to: no  Fidgets with hands or feet: yes Unable to stay in seat: no Blurts out/interrupts others: sometimes ADHD Medication Side Effects: no    Decreased appetite: no    Headache: no    Sleeping disturbance pattern: yes    Irritability: somtimes    Rebound effects (worse than baseline) off medication: no    Anxiousness:  sometimes    Dizziness: no    Tics: no  INSOMNIA Duration: years Satisfied with sleep quality: yes Difficulty falling asleep: yes Difficulty staying asleep: yes Waking a few hours after sleep onset: no Early morning awakenings: yes Daytime hypersomnolence: no Wakes feeling refreshed: yes Good sleep hygiene: yes Apnea: no Snoring: no Depressed/anxious mood:  sometimes Recent stress:  currently in school Restless legs/nocturnal leg cramps: no Chronic pain/arthritis: no History of sleep study: no Treatments attempted: [place]    Relevant past medical, surgical, family and social history reviewed and updated as indicated. Interim medical history since our last visit reviewed. Allergies and medications reviewed and updated.  Review of Systems  Neurological:  Negative for dizziness and headaches.  Psychiatric/Behavioral:  Positive for decreased  concentration and sleep disturbance. Negative for agitation. The patient is nervous/anxious.    Per HPI unless specifically indicated above     Objective:    BP 118/70   Pulse (!) 42   Temp 98 F (36.7 C)   Wt 162 lb (73.5 kg)   SpO2 97%   BMI 25.04 kg/m   Wt Readings from Last 3 Encounters:  03/19/21 162 lb (73.5 kg)  01/17/21 164 lb 7.4 oz (74.6 kg)  01/10/21 164 lb 6 oz (74.6 kg)    Physical Exam Vitals and nursing note reviewed.  Constitutional:      General: He is not in acute distress.    Appearance: Normal appearance. He is not ill-appearing, toxic-appearing or diaphoretic.  HENT:     Head: Normocephalic.     Right Ear: External ear normal.     Left Ear: External ear normal.     Nose: Nose normal. No congestion or rhinorrhea.     Mouth/Throat:     Mouth: Mucous membranes are moist.  Eyes:     General:        Right eye: No discharge.        Left eye: No discharge.     Extraocular Movements: Extraocular movements intact.     Conjunctiva/sclera: Conjunctivae normal.     Pupils: Pupils are equal, round, and reactive to light.  Cardiovascular:     Rate and Rhythm: Normal rate and regular rhythm.     Heart sounds: No murmur heard. Pulmonary:     Effort: Pulmonary effort is normal. No respiratory distress.     Breath sounds:  Normal breath sounds. No wheezing, rhonchi or rales.  Abdominal:     General: Abdomen is flat. Bowel sounds are normal.  Musculoskeletal:     Cervical back: Normal range of motion and neck supple.  Skin:    General: Skin is warm and dry.     Capillary Refill: Capillary refill takes less than 2 seconds.  Neurological:     General: No focal deficit present.     Mental Status: He is alert and oriented to person, place, and time.  Psychiatric:        Mood and Affect: Mood normal.        Behavior: Behavior normal.        Thought Content: Thought content normal.        Judgment: Judgment normal.    Results for orders placed or performed in  visit on 01/10/21  TSH  Result Value Ref Range   TSH 1.590 0.450 - 4.500 uIU/mL  Lipid panel  Result Value Ref Range   Cholesterol, Total 147 100 - 199 mg/dL   Triglycerides 82 0 - 149 mg/dL   HDL 58 >39 mg/dL   VLDL Cholesterol Cal 16 5 - 40 mg/dL   LDL Chol Calc (NIH) 73 0 - 99 mg/dL   Chol/HDL Ratio 2.5 0.0 - 5.0 ratio  CBC with Differential/Platelet  Result Value Ref Range   WBC 4.7 3.4 - 10.8 x10E3/uL   RBC 4.93 4.14 - 5.80 x10E6/uL   Hemoglobin 15.3 13.0 - 17.7 g/dL   Hematocrit 44.7 37.5 - 51.0 %   MCV 91 79 - 97 fL   MCH 31.0 26.6 - 33.0 pg   MCHC 34.2 31.5 - 35.7 g/dL   RDW 12.9 11.6 - 15.4 %   Platelets 160 150 - 450 x10E3/uL   Neutrophils 39 Not Estab. %   Lymphs 49 Not Estab. %   Monocytes 10 Not Estab. %   Eos 1 Not Estab. %   Basos 1 Not Estab. %   Neutrophils Absolute 1.8 1.4 - 7.0 x10E3/uL   Lymphocytes Absolute 2.3 0.7 - 3.1 x10E3/uL   Monocytes Absolute 0.5 0.1 - 0.9 x10E3/uL   EOS (ABSOLUTE) 0.0 0.0 - 0.4 x10E3/uL   Basophils Absolute 0.0 0.0 - 0.2 x10E3/uL   Immature Granulocytes 0 Not Estab. %   Immature Grans (Abs) 0.0 0.0 - 0.1 x10E3/uL  Comprehensive metabolic panel  Result Value Ref Range   Glucose 84 65 - 99 mg/dL   BUN 16 6 - 20 mg/dL   Creatinine, Ser 0.88 0.76 - 1.27 mg/dL   eGFR 122 >59 mL/min/1.73   BUN/Creatinine Ratio 18 9 - 20   Sodium 144 134 - 144 mmol/L   Potassium 4.7 3.5 - 5.2 mmol/L   Chloride 101 96 - 106 mmol/L   CO2 24 20 - 29 mmol/L   Calcium 9.8 8.7 - 10.2 mg/dL   Total Protein 7.3 6.0 - 8.5 g/dL   Albumin 5.1 4.1 - 5.2 g/dL   Globulin, Total 2.2 1.5 - 4.5 g/dL   Albumin/Globulin Ratio 2.3 (H) 1.2 - 2.2   Bilirubin Total 0.4 0.0 - 1.2 mg/dL   Alkaline Phosphatase 77 44 - 121 IU/L   AST 35 0 - 40 IU/L   ALT 39 0 - 44 IU/L  Urinalysis, Routine w reflex microscopic  Result Value Ref Range   Specific Gravity, UA 1.010 1.005 - 1.030   pH, UA 6.0 5.0 - 7.5   Color, UA Yellow Yellow   Appearance Ur Clear Clear  Leukocytes,UA Negative Negative   Protein,UA Negative Negative/Trace   Glucose, UA Negative Negative   Ketones, UA Negative Negative   RBC, UA Negative Negative   Bilirubin, UA Negative Negative   Urobilinogen, Ur 0.2 0.2 - 1.0 mg/dL   Nitrite, UA Negative Negative  020891 11+Oxyco+Alc+Crt-Bund  Result Value Ref Range   Ethanol Negative Cutoff=0.020 %   Amphetamines, Urine Negative Cutoff=1000 ng/mL   Barbiturate Negative Cutoff=200 ng/mL   BENZODIAZ UR QL Negative Cutoff=200 ng/mL   Cannabinoid Quant, Ur Negative Cutoff=50 ng/mL   Cocaine (Metabolite) Negative Cutoff=300 ng/mL   OPIATE SCREEN URINE Negative Cutoff=300 ng/mL   Oxycodone/Oxymorphone, Urine Negative Cutoff=300 ng/mL   Phencyclidine Negative Cutoff=25 ng/mL   Methadone Screen, Urine Negative Cutoff=300 ng/mL   Propoxyphene Negative Cutoff=300 ng/mL   Meperidine Negative Cutoff=200 ng/mL   Tramadol Negative Cutoff=200 ng/mL   Creatinine 57.8 20.0 - 300.0 mg/dL   pH, Urine 5.7 4.5 - 8.9      Assessment & Plan:   Problem List Items Addressed This Visit       Other   ADHD    Chronic.  Improved sine last month.  Patient only takes it on days he has school.  Agreed to give patient Adderall until he finishes school next years. Controlled substance agreement and UDS obtained today. Refills sent.       Insomnia - Primary    Chronic.  Controlled with medication regimen.  Patient understands the risks of taking hypnotic medication and okay to continue.  Controlled substance agreement and UDS obtained. Refills sent today.        Other Visit Diagnoses     Immunization due       Relevant Orders   HPV 9-valent vaccine,Recombinat (Completed)        Follow up plan: Return in about 3 months (around 06/19/2021) for ADHD FU.

## 2021-03-19 NOTE — Assessment & Plan Note (Signed)
Chronic.  Controlled with medication regimen.  Patient understands the risks of taking hypnotic medication and okay to continue.  Controlled substance agreement and UDS obtained. Refills sent today.

## 2021-03-19 NOTE — Assessment & Plan Note (Signed)
Chronic.  Improved sine last month.  Patient only takes it on days he has school.  Agreed to give patient Adderall until he finishes school next years. Controlled substance agreement and UDS obtained today. Refills sent.

## 2021-03-21 ENCOUNTER — Ambulatory Visit: Payer: PRIVATE HEALTH INSURANCE | Admitting: Nurse Practitioner

## 2021-04-21 DIAGNOSIS — R1013 Epigastric pain: Secondary | ICD-10-CM

## 2021-04-24 ENCOUNTER — Telehealth: Payer: Self-pay | Admitting: Internal Medicine

## 2021-04-24 NOTE — Telephone Encounter (Signed)
   [facility] Pre-operative Risk Assessment    Patient Name: Dillon Sosa  DOB: 03/16/1996 MRN: [id number]  HEARTCARE STAFF:  - IMPORTANT!!!!!! Under Visit Info/Reason for Call, type in Other and utilize the format Clearance MM/DD/YY or Clearance TBD. Do not use dashes or single digits. - Please review there is not already an duplicate clearance open for this procedure. - If request is for dental extraction, please clarify the # of teeth to be extracted. - If the patient is currently at the dentist's office, call Pre-Op Callback Staff (MA/nurse) to input urgent request.  - If the patient is not currently in the dentist office, please route to the Pre-Op pool.  Request for surgical clearance:  What type of surgery is being performed? EXTRACTION OF 2 WISDOM TEETH  When is this surgery scheduled? TBD  What type of clearance is required (medical clearance vs. Pharmacy clearance to hold med vs. Both)? NOT LISTED  Are there any medications that need to be held prior to surgery and how long? NOT LISTED  Practice name and name of physician performing surgery? [facility],   What is the office phone number? [phone]   7.   What is the office fax number? [phone]  8.   Anesthesia type (None, local, MAC, general) ? LOCAL   Gabriel Cirri L Christalynn [place] 04/24/2021, 8:56 AM  _________________________________________________________________   (provider comments below)

## 2021-04-24 NOTE — Telephone Encounter (Signed)
   Name: Dillon Sosa  DOB: 1996-03-17  MRN: [id number]   Primary Cardiologist: Dr. Cristal Deer End  Chart reviewed as part of pre-operative protocol coverage. Patient was contacted 04/24/2021 in reference to pre-operative risk assessment for pending surgery as outlined below.  Dillon Sosa was last seen on 11/2020 for asymptomatic sinus bradycardia felt due to good cardiovascular conditioning. I reached out to patient for update on how he is doing. The patient affirms he has been doing well without any new cardiac symptoms. Therefore, based on ACC/AHA guidelines, the patient would be at acceptable risk for the planned procedure without further cardiovascular testing. The patient was advised that if he develops new symptoms prior to surgery to contact our office to arrange for a follow-up visit, and he verbalized understanding. Based on available records, SBE prophylaxis not indicated from cardiac standpoint.  I will route this recommendation to the requesting party via Epic fax function and remove from pre-op pool. Please call with questions.  Laurann Montana, PA-C 04/24/2021, 3:50 PM

## 2021-05-07 ENCOUNTER — Ambulatory Visit: Payer: No Typology Code available for payment source

## 2021-05-16 ENCOUNTER — Ambulatory Visit
Admission: RE | Admit: 2021-05-16 | Discharge: 2021-05-16 | Disposition: A | Discharge status: Home / Self Care | Payer: No Typology Code available for payment source | Source: Ambulatory Visit | Attending: Nurse Practitioner | Admitting: Nurse Practitioner

## 2021-05-16 ENCOUNTER — Other Ambulatory Visit: Payer: Self-pay

## 2021-05-16 DIAGNOSIS — R1013 Epigastric pain: Secondary | ICD-10-CM | POA: Insufficient documentation

## 2021-05-20 ENCOUNTER — Other Ambulatory Visit: Payer: Self-pay | Admitting: Nurse Practitioner

## 2021-05-20 DIAGNOSIS — R1084 Generalized abdominal pain: Secondary | ICD-10-CM

## 2021-06-06 ENCOUNTER — Ambulatory Visit
Admission: RE | Admit: 2021-06-06 | Discharge: 2021-06-06 | Disposition: A | Discharge status: Home / Self Care | Payer: No Typology Code available for payment source | Source: Ambulatory Visit | Attending: Nurse Practitioner | Admitting: Nurse Practitioner

## 2021-06-06 ENCOUNTER — Other Ambulatory Visit: Payer: Self-pay

## 2021-06-06 DIAGNOSIS — R1084 Generalized abdominal pain: Secondary | ICD-10-CM

## 2021-06-13 ENCOUNTER — Ambulatory Visit
Admission: RE | Admit: 2021-06-13 | Discharge: 2021-06-13 | Disposition: A | Discharge status: Home / Self Care | Payer: No Typology Code available for payment source | Source: Ambulatory Visit | Attending: Nurse Practitioner | Admitting: Nurse Practitioner

## 2021-06-13 ENCOUNTER — Other Ambulatory Visit: Payer: Self-pay

## 2021-06-13 DIAGNOSIS — R1084 Generalized abdominal pain: Secondary | ICD-10-CM | POA: Diagnosis not present

## 2021-06-13 MED ORDER — IOHEXOL 350 MG/ML SOLN
100.0000 mL | Freq: Once | INTRAVENOUS | Status: AC | PRN
  Administered 2021-06-13: 100 mL via INTRAVENOUS

## 2021-06-16 NOTE — Progress Notes (Signed)
Hi Dillon Sosa. 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  This is an incidental finding and nothing needs to be done at this time.  Otherwise your CT was normal.

## 2021-06-20 ENCOUNTER — Ambulatory Visit: Payer: PRIVATE HEALTH INSURANCE | Admitting: Nurse Practitioner

## 2021-06-27 ENCOUNTER — Ambulatory Visit: Payer: PRIVATE HEALTH INSURANCE | Admitting: Nurse Practitioner

## 2021-06-27 ENCOUNTER — Other Ambulatory Visit: Payer: Self-pay

## 2021-06-27 ENCOUNTER — Encounter: Payer: Self-pay | Admitting: Nurse Practitioner

## 2021-06-27 VITALS — BP 104/50 | HR 51 | Temp 98.3°F | Ht 67.0 in | Wt 161.6 lb

## 2021-06-27 DIAGNOSIS — F909 Attention-deficit hyperactivity disorder, unspecified type: Secondary | ICD-10-CM

## 2021-06-27 DIAGNOSIS — G4700 Insomnia, unspecified: Secondary | ICD-10-CM | POA: Diagnosis not present

## 2021-06-27 DIAGNOSIS — Z79899 Other long term (current) drug therapy: Secondary | ICD-10-CM

## 2021-06-27 MED ORDER — AMPHETAMINE-DEXTROAMPHET ER 20 MG PO CP24: 20.0000 mg | ORAL_CAPSULE | Freq: Every day | ORAL | 0 refills | Status: DC

## 2021-06-27 MED ORDER — ZOLPIDEM TARTRATE 5 MG PO TABS: 5.0000 mg | ORAL_TABLET | Freq: Every evening | ORAL | 0 refills | Status: DC | PRN

## 2021-06-27 NOTE — Assessment & Plan Note (Signed)
Chronic.  Well controlled. Patient only takes it on days he has school.  Agreed to give patient Adderall until he finishes school next years. Controlled substance agreement and UDS up to date.  Refills sent.  Follow up in 3 months for reevaluation.

## 2021-06-27 NOTE — Assessment & Plan Note (Signed)
Chronic.  Controlled with medication regimen.  Patient understands the risks of taking hypnotic medication and okay to continue.  Controlled substance agreement and UDS up to date. Refills sent today. Follow up in 3 months.

## 2021-06-27 NOTE — Progress Notes (Signed)
BP (!) 104/50   Pulse (!) 51   Temp 98.3 F (36.8 C) (Oral)   Ht _0  (1.702 m)   Wt 161 lb 9.6 oz (73.3 kg)   SpO2 98%   BMI 25.31 kg/m    Subjective:    Patient ID: Dillon Sosa, male    DOB: [birth year], 25 y.o.   MRN: [id number]  HPI: Dillon Sosa is a 25 y.o. male  Chief Complaint  Patient presents with   ADHD    3 month f/up    ADHD FOLLOW UP ADHD status: controlled Satisfied with current therapy: yes Medication compliance:  excellent compliance Controlled substance contract: yes Previous psychiatry evaluation: no Previous medications: no adderall   Taking meds on weekends/vacations: no Work/school performance:  good Difficulty sustaining attention/completing tasks: no Distracted by extraneous stimuli: no Does not listen when spoken to: no  Fidgets with hands or feet: yes Unable to stay in seat: no Blurts out/interrupts others: sometimes ADHD Medication Side Effects: no    Decreased appetite: no    Headache: no    Sleeping disturbance pattern: yes    Irritability: somtimes    Rebound effects (worse than baseline) off medication: no    Anxiousness:  sometimes    Dizziness: no    Tics: no  INSOMNIA Duration: years Satisfied with sleep quality: yes Difficulty falling asleep: yes Difficulty staying asleep: yes Waking a few hours after sleep onset: no Early morning awakenings: yes Daytime hypersomnolence: no Wakes feeling refreshed: yes Good sleep hygiene: yes Apnea: no Snoring: no Depressed/anxious mood:  sometimes Recent stress:  currently in school Restless legs/nocturnal leg cramps: no Chronic pain/arthritis: no History of sleep study: no Treatments attempted: [place]    Relevant past medical, surgical, family and social history reviewed and updated as indicated. Interim medical history since our last visit reviewed. Allergies and medications reviewed and updated.  Review of Systems  Neurological:  Negative for dizziness and  headaches.  Psychiatric/Behavioral:  Positive for decreased concentration and sleep disturbance. Negative for agitation. The patient is nervous/anxious.    Per HPI unless specifically indicated above     Objective:    BP (!) 104/50   Pulse (!) 51   Temp 98.3 F (36.8 C) (Oral)   Ht _1  (1.702 m)   Wt 161 lb 9.6 oz (73.3 kg)   SpO2 98%   BMI 25.31 kg/m   Wt Readings from Last 3 Encounters:  06/27/21 161 lb 9.6 oz (73.3 kg)  03/19/21 162 lb (73.5 kg)  01/17/21 164 lb 7.4 oz (74.6 kg)    Physical Exam Vitals and nursing note reviewed.  Constitutional:      General: He is not in acute distress.    Appearance: Normal appearance. He is not ill-appearing, toxic-appearing or diaphoretic.  HENT:     Head: Normocephalic.     Right Ear: External ear normal.     Left Ear: External ear normal.     Nose: Nose normal. No congestion or rhinorrhea.     Mouth/Throat:     Mouth: Mucous membranes are moist.  Eyes:     General:        Right eye: No discharge.        Left eye: No discharge.     Extraocular Movements: Extraocular movements intact.     Conjunctiva/sclera: Conjunctivae normal.     Pupils: Pupils are equal, round, and reactive to light.  Cardiovascular:     Rate and Rhythm: Normal rate and regular  rhythm.     Heart sounds: No murmur heard. Pulmonary:     Effort: Pulmonary effort is normal. No respiratory distress.     Breath sounds: Normal breath sounds. No wheezing, rhonchi or rales.  Abdominal:     General: Abdomen is flat. Bowel sounds are normal.  Musculoskeletal:     Cervical back: Normal range of motion and neck supple.  Skin:    General: Skin is warm and dry.     Capillary Refill: Capillary refill takes less than 2 seconds.  Neurological:     General: No focal deficit present.     Mental Status: He is alert and oriented to person, place, and time.  Psychiatric:        Mood and Affect: Mood normal.        Behavior: Behavior normal.        Thought Content:  Thought content normal.        Judgment: Judgment normal.    Results for orders placed or performed in visit on 01/10/21  TSH  Result Value Ref Range   TSH 1.590 0.450 - 4.500 uIU/mL  Lipid panel  Result Value Ref Range   Cholesterol, Total 147 100 - 199 mg/dL   Triglycerides 82 0 - 149 mg/dL   HDL 58 >39 mg/dL   VLDL Cholesterol Cal 16 5 - 40 mg/dL   LDL Chol Calc (NIH) 73 0 - 99 mg/dL   Chol/HDL Ratio 2.5 0.0 - 5.0 ratio  CBC with Differential/Platelet  Result Value Ref Range   WBC 4.7 3.4 - 10.8 x10E3/uL   RBC 4.93 4.14 - 5.80 x10E6/uL   Hemoglobin 15.3 13.0 - 17.7 g/dL   Hematocrit 44.7 37.5 - 51.0 %   MCV 91 79 - 97 fL   MCH 31.0 26.6 - 33.0 pg   MCHC 34.2 31.5 - 35.7 g/dL   RDW 12.9 11.6 - 15.4 %   Platelets 160 150 - 450 x10E3/uL   Neutrophils 39 Not Estab. %   Lymphs 49 Not Estab. %   Monocytes 10 Not Estab. %   Eos 1 Not Estab. %   Basos 1 Not Estab. %   Neutrophils Absolute 1.8 1.4 - 7.0 x10E3/uL   Lymphocytes Absolute 2.3 0.7 - 3.1 x10E3/uL   Monocytes Absolute 0.5 0.1 - 0.9 x10E3/uL   EOS (ABSOLUTE) 0.0 0.0 - 0.4 x10E3/uL   Basophils Absolute 0.0 0.0 - 0.2 x10E3/uL   Immature Granulocytes 0 Not Estab. %   Immature Grans (Abs) 0.0 0.0 - 0.1 x10E3/uL  Comprehensive metabolic panel  Result Value Ref Range   Glucose 84 65 - 99 mg/dL   BUN 16 6 - 20 mg/dL   Creatinine, Ser 0.88 0.76 - 1.27 mg/dL   eGFR 122 >59 mL/min/1.73   BUN/Creatinine Ratio 18 9 - 20   Sodium 144 134 - 144 mmol/L   Potassium 4.7 3.5 - 5.2 mmol/L   Chloride 101 96 - 106 mmol/L   CO2 24 20 - 29 mmol/L   Calcium 9.8 8.7 - 10.2 mg/dL   Total Protein 7.3 6.0 - 8.5 g/dL   Albumin 5.1 4.1 - 5.2 g/dL   Globulin, Total 2.2 1.5 - 4.5 g/dL   Albumin/Globulin Ratio 2.3 (H) 1.2 - 2.2   Bilirubin Total 0.4 0.0 - 1.2 mg/dL   Alkaline Phosphatase 77 44 - 121 IU/L   AST 35 0 - 40 IU/L   ALT 39 0 - 44 IU/L  Urinalysis, Routine w reflex microscopic  Result Value Ref Range  Specific Gravity, UA  1.010 1.005 - 1.030   pH, UA 6.0 5.0 - 7.5   Color, UA Yellow Yellow   Appearance Ur Clear Clear   Leukocytes,UA Negative Negative   Protein,UA Negative Negative/Trace   Glucose, UA Negative Negative   Ketones, UA Negative Negative   RBC, UA Negative Negative   Bilirubin, UA Negative Negative   Urobilinogen, Ur 0.2 0.2 - 1.0 mg/dL   Nitrite, UA Negative Negative  [id number] 11+Oxyco+Alc+Crt-Bund  Result Value Ref Range   Ethanol Negative Cutoff=0.020 %   Amphetamines, Urine Negative Cutoff=1000 ng/mL   Barbiturate Negative Cutoff=200 ng/mL   BENZODIAZ UR QL Negative Cutoff=200 ng/mL   Cannabinoid Quant, Ur Negative Cutoff=50 ng/mL   Cocaine (Metabolite) Negative Cutoff=300 ng/mL   OPIATE SCREEN URINE Negative Cutoff=300 ng/mL   Oxycodone/Oxymorphone, Urine Negative Cutoff=300 ng/mL   Phencyclidine Negative Cutoff=25 ng/mL   Methadone Screen, Urine Negative Cutoff=300 ng/mL   Propoxyphene Negative Cutoff=300 ng/mL   Meperidine Negative Cutoff=200 ng/mL   Tramadol Negative Cutoff=200 ng/mL   Creatinine 57.8 20.0 - 300.0 mg/dL   pH, Urine 5.7 4.5 - 8.9      Assessment & Plan:   Problem List Items Addressed This Visit       Other   ADHD - Primary    Chronic.  Well controlled. Patient only takes it on days he has school.  Agreed to give patient Adderall until he finishes school next years. Controlled substance agreement and UDS up to date.  Refills sent.  Follow up in 3 months for reevaluation.      Insomnia    Chronic.  Controlled with medication regimen.  Patient understands the risks of taking hypnotic medication and okay to continue.  Controlled substance agreement and UDS up to date. Refills sent today. Follow up in 3 months.      Controlled substance agreement signed    Up to date.         Follow up plan: Return in about 3 months (around 09/27/2021) for ADHD FU and 3rd HPV.

## 2021-06-27 NOTE — Assessment & Plan Note (Signed)
Up to date

## 2021-07-01 DIAGNOSIS — Z3009 Encounter for other general counseling and advice on contraception: Secondary | ICD-10-CM

## 2021-07-03 ENCOUNTER — Encounter: Payer: Self-pay | Admitting: *Deleted

## 2021-07-11 ENCOUNTER — Ambulatory Visit (INDEPENDENT_AMBULATORY_CARE_PROVIDER_SITE_OTHER): Payer: PRIVATE HEALTH INSURANCE | Admitting: Urology

## 2021-07-11 ENCOUNTER — Other Ambulatory Visit: Payer: Self-pay

## 2021-07-11 ENCOUNTER — Encounter: Payer: Self-pay | Admitting: Urology

## 2021-07-11 VITALS — BP 113/46 | HR 50 | Ht 66.0 in | Wt 159.0 lb

## 2021-07-11 DIAGNOSIS — Z3009 Encounter for other general counseling and advice on contraception: Secondary | ICD-10-CM | POA: Diagnosis not present

## 2021-07-11 MED ORDER — DIAZEPAM 10 MG PO TABS: 10.0000 mg | ORAL_TABLET | Freq: Once | ORAL | 0 refills | Status: AC

## 2021-07-11 NOTE — Patient Instructions (Signed)

## 2021-07-11 NOTE — Progress Notes (Signed)
07/11/2021 1:49 PM   Dillon Sosa Oct 11, 1995 [id number]  Referring provider: Larae Grooms, NP [address] [place],  [place] [address]  Chief Complaint  Patient presents with   VAS Consult    New Patient    HPI: 25 y.o. year old male referred for further evaluation of possible vasectomy.  He denies a history of testicular trauma or pain.  No urinary issues.  No previous scrotal surgeries.  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  He is not married and has no kids but has never wanted children and has been consistent since he was a teenager.  He is very sure about wanting to pursue vasectomy.   PMH: Past Medical History:  Diagnosis Date   ADHD    Insomnia     Surgical History: Past Surgical History:  Procedure Laterality Date   FOOT SURGERY Left 2016   WISDOM TOOTH EXTRACTION      Home Medications:  Allergies as of 07/11/2021   No Known Allergies      Medication List        Accurate as of July 11, 2021  1:49 PM. If you have any questions, ask your nurse or doctor.          amphetamine-dextroamphetamine 20 MG 24 hr capsule Commonly known as: Adderall XR Take 1 capsule (20 mg total) by mouth daily. Start taking on: August 27, 2021   b complex vitamins capsule Take 1 capsule by mouth daily.   diazepam 10 MG tablet Commonly known as: Valium Take 1 tablet (10 mg total) by mouth once for 1 dose. Take 1 hour prior to procedure. Please have a driver available. Started by: Vanna Scotland, MD   PROBIOTIC-10 PO Take 1 tablet by mouth daily.   traZODone 150 MG tablet Commonly known as: DESYREL TAKE 1 TABLET BY MOUTH AT BEDTIME.   Vitamin D (Ergocalciferol) 50 MCG (2000 UT) Caps Take 2,000 Units by mouth daily.   zolpidem 5 MG tablet Commonly known as: AMBIEN Take 1 tablet (5 mg total) by mouth at bedtime as needed for sleep. Start taking on: August 27, 2021        Allergies: No Known  Allergies  Family History: Family History  Problem Relation Age of Onset   Breast cancer Mother    Other Mother        Low heart rate   Insomnia Father    Heart attack Maternal Grandmother    Prostate cancer Neg Hx    Bladder Cancer Neg Hx    Kidney cancer Neg Hx     Social History:  reports that he quit smoking about a year ago. His smoking use included cigarettes. He quit smokeless tobacco use about 3 years ago.  His smokeless tobacco use included chew. He reports current alcohol use. He reports that he does not currently use drugs after having used the following drugs: Cocaine.   Physical Exam: BP (!) 113/46   Pulse (!) 50   Ht 5\' 6"  (1.676 m)   Wt 159 lb (72.1 kg)   BMI 25.66 kg/m   Constitutional:  Alert and oriented, No acute distress. HEENT: [place] AT, moist mucus membranes.  Trachea midline, no masses. Cardiovascular: No clubbing, cyanosis, or edema. Respiratory: Normal respiratory effort, no increased work of breathing. Skin: No rashes, bruises or suspicious lesions. Neurologic: Grossly intact, no focal deficits, moving all 4 extremities. Psychiatric: Normal mood and affect.   Assessment & Plan:  1. Vasectomy evaluation Today, we discussed what the vas deferens is, where it is located, and its function. We reviewed the procedure for vasectomy, it's risks, benefits, alternatives, and likelihood of achieving his goals. We discussed in detail the procedure, complications, and recovery as well as the need for clearance prior to unprotected intercourse. We discussed that vasectomy does not protect against sexually transmitted diseases. We discussed that this procedure does not result in immediate sterility and that they would need to use other forms of birth control until he has been cleared with negative postvasectomy semen analyses. I explained that the procedure is considered to be permanent and that attempts at reversal have varying degrees of success. These options include  vasectomy reversal, sperm retrieval, and in vitro fertilization; these can be very expensive. We discussed the chance of postvasectomy pain syndrome which occurs in less than 5% of patients. I explained to the patient that there is no treatment to resolve this chronic pain, and that if it developed I would not be able to help resolve the issue, but that surgery is generally not needed for correction. I explained there have even been reports of systemic like illness associated with this chronic pain, and that there was no good cure. I explained that vasectomy it is not a 100% reliable form of birth control, and the risk of pregnancy after vasectomy is approximately 1 in 2000 men who had a negative postvasectomy semen analysis or rare non-motile sperm. I explained that repeat vasectomy was necessary in less than 1% of vasectomy procedures when employing the type of technique that I use. I explained that he should refrain from ejaculation for approximately one week following vasectomy. I explained that there are other options for birth control which are permanent and non-permanent; we discussed these. I explained the rates of surgical complications, such as symptomatic hematoma or infection, are low (1-2%) and vary with the surgeon's experience and criteria used to diagnose the complication.   The patient had the opportunity to ask questions to his stated satisfaction. He voiced understanding of the above factors and stated that he has read all the information provided to him and the packets and informed consent.  He is interested in receiving of Valium 10 mg prior to the procedure for the purpose of anxiolysis.  A prescription was given today.  He will have a driver on the day of the procedure.   Vanna Scotland, MD  [facility] [address], [address] [place], [place] [address] [phone]

## 2021-08-26 ENCOUNTER — Other Ambulatory Visit: Payer: Self-pay | Admitting: Nurse Practitioner

## 2021-08-26 NOTE — Telephone Encounter (Signed)
Requested Prescriptions  Pending Prescriptions Disp Refills  . traZODone (DESYREL) 150 MG tablet [Pharmacy Med Name: TRAZODONE 150 MG TABLET] 90 tablet 0    Sig: TAKE 1 TABLET BY MOUTH EVERYDAY AT BEDTIME     Psychiatry: Antidepressants - Serotonin Modulator Passed - 08/26/2021  1:34 AM      Passed - Valid encounter within last 6 months    Recent Outpatient Visits          2 months ago Attention deficit hyperactivity disorder (ADHD), unspecified ADHD type   [facility] Larae Grooms, NP   5 months ago Insomnia, unspecified type   [facility] Larae Grooms, NP   7 months ago Annual physical exam   [facility] Larae Grooms, NP   9 months ago Attention deficit hyperactivity disorder (ADHD), unspecified ADHD type   [facility] Larae Grooms, NP   12 months ago Numbness   [facility] Gabriel Cirri, NP      Future Appointments            In 1 month Larae Grooms, NP [facility]

## 2021-10-03 ENCOUNTER — Ambulatory Visit: Payer: PRIVATE HEALTH INSURANCE | Admitting: Nurse Practitioner

## 2021-10-09 NOTE — Progress Notes (Signed)
BP 107/64    Pulse (!) 44    Temp 97.9 F (36.6 C) (Oral)    Ht 5' 5.98" (1.676 m)    Wt 165 lb 8 oz (75.1 kg)    SpO2 99%    BMI 26.73 kg/m    Subjective:    Patient ID: Dillon Sosa, male    DOB: [birth year], 26 y.o.   MRN: [id number]  HPI: Dillon Sosa is a 26 y.o. male  Chief Complaint  Patient presents with   ADHD   ADHD FOLLOW UP ADHD status: controlled- patient hasn't been taking it for about 2 months due to not having refills.  He doesn't like taking the medication but see's a difference in his production when he does take.  Satisfied with current therapy: yes Medication compliance:  excellent compliance Controlled substance contract: yes Previous psychiatry evaluation: no Previous medications: no adderall   Taking meds on weekends/vacations: no Work/school performance:  good Difficulty sustaining attention/completing tasks: no Distracted by extraneous stimuli: no Does not listen when spoken to: no  Fidgets with hands or feet: yes Unable to stay in seat: no Blurts out/interrupts others: sometimes ADHD Medication Side Effects: no    Decreased appetite: no    Headache: no    Sleeping disturbance pattern: yes    Irritability: somtimes    Rebound effects (worse than baseline) off medication: no    Anxiousness:  sometimes    Dizziness: no    Tics: no  INSOMNIA Duration: years Satisfied with sleep quality: yes Difficulty falling asleep: yes Difficulty staying asleep: yes Waking a few hours after sleep onset: no Early morning awakenings: yes Daytime hypersomnolence: no Wakes feeling refreshed: yes Good sleep hygiene: yes Apnea: no Snoring: no Depressed/anxious mood:  sometimes Recent stress:  currently in school Restless legs/nocturnal leg cramps: no Chronic pain/arthritis: no History of sleep study: no Treatments attempted: ambien   Patient has ring worm on the back of his left arm.  Relevant past medical, surgical, family and social  history reviewed and updated as indicated. Interim medical history since our last visit reviewed. Allergies and medications reviewed and updated.  Review of Systems  Skin:  Positive for rash.  Neurological:  Negative for dizziness and headaches.  Psychiatric/Behavioral:  Positive for decreased concentration and sleep disturbance. Negative for agitation. The patient is nervous/anxious.    Per HPI unless specifically indicated above     Objective:    BP 107/64    Pulse (!) 44    Temp 97.9 F (36.6 C) (Oral)    Ht 5' 5.98" (1.676 m)    Wt 165 lb 8 oz (75.1 kg)    SpO2 99%    BMI 26.73 kg/m   Wt Readings from Last 3 Encounters:  10/10/21 165 lb 8 oz (75.1 kg)  07/11/21 159 lb (72.1 kg)  06/27/21 161 lb 9.6 oz (73.3 kg)    Physical Exam Vitals and nursing note reviewed.  Constitutional:      General: He is not in acute distress.    Appearance: Normal appearance. He is not ill-appearing, toxic-appearing or diaphoretic.  HENT:     Head: Normocephalic.     Right Ear: External ear normal.     Left Ear: External ear normal.     Nose: Nose normal. No congestion or rhinorrhea.     Mouth/Throat:     Mouth: Mucous membranes are moist.  Eyes:     General:        Right eye: No discharge.  Left eye: No discharge.     Extraocular Movements: Extraocular movements intact.     Conjunctiva/sclera: Conjunctivae normal.     Pupils: Pupils are equal, round, and reactive to light.  Cardiovascular:     Rate and Rhythm: Normal rate and regular rhythm.     Heart sounds: No murmur heard. Pulmonary:     Effort: Pulmonary effort is normal. No respiratory distress.     Breath sounds: Normal breath sounds. No wheezing, rhonchi or rales.  Abdominal:     General: Abdomen is flat. Bowel sounds are normal.  Musculoskeletal:     Cervical back: Normal range of motion and neck supple.  Skin:    General: Skin is warm and dry.     Capillary Refill: Capillary refill takes less than 2 seconds.        Neurological:     General: No focal deficit present.     Mental Status: He is alert and oriented to person, place, and time.  Psychiatric:        Mood and Affect: Mood normal.        Behavior: Behavior normal.        Thought Content: Thought content normal.        Judgment: Judgment normal.    Results for orders placed or performed in visit on 01/10/21  TSH  Result Value Ref Range   TSH 1.590 0.450 - 4.500 uIU/mL  Lipid panel  Result Value Ref Range   Cholesterol, Total 147 100 - 199 mg/dL   Triglycerides 82 0 - 149 mg/dL   HDL 58 >39 mg/dL   VLDL Cholesterol Cal 16 5 - 40 mg/dL   LDL Chol Calc (NIH) 73 0 - 99 mg/dL   Chol/HDL Ratio 2.5 0.0 - 5.0 ratio  CBC with Differential/Platelet  Result Value Ref Range   WBC 4.7 3.4 - 10.8 x10E3/uL   RBC 4.93 4.14 - 5.80 x10E6/uL   Hemoglobin 15.3 13.0 - 17.7 g/dL   Hematocrit 44.7 37.5 - 51.0 %   MCV 91 79 - 97 fL   MCH 31.0 26.6 - 33.0 pg   MCHC 34.2 31.5 - 35.7 g/dL   RDW 12.9 11.6 - 15.4 %   Platelets 160 150 - 450 x10E3/uL   Neutrophils 39 Not Estab. %   Lymphs 49 Not Estab. %   Monocytes 10 Not Estab. %   Eos 1 Not Estab. %   Basos 1 Not Estab. %   Neutrophils Absolute 1.8 1.4 - 7.0 x10E3/uL   Lymphocytes Absolute 2.3 0.7 - 3.1 x10E3/uL   Monocytes Absolute 0.5 0.1 - 0.9 x10E3/uL   EOS (ABSOLUTE) 0.0 0.0 - 0.4 x10E3/uL   Basophils Absolute 0.0 0.0 - 0.2 x10E3/uL   Immature Granulocytes 0 Not Estab. %   Immature Grans (Abs) 0.0 0.0 - 0.1 x10E3/uL  Comprehensive metabolic panel  Result Value Ref Range   Glucose 84 65 - 99 mg/dL   BUN 16 6 - 20 mg/dL   Creatinine, Ser 0.88 0.76 - 1.27 mg/dL   eGFR 122 >59 mL/min/1.73   BUN/Creatinine Ratio 18 9 - 20   Sodium 144 134 - 144 mmol/L   Potassium 4.7 3.5 - 5.2 mmol/L   Chloride 101 96 - 106 mmol/L   CO2 24 20 - 29 mmol/L   Calcium 9.8 8.7 - 10.2 mg/dL   Total Protein 7.3 6.0 - 8.5 g/dL   Albumin 5.1 4.1 - 5.2 g/dL   Globulin, Total 2.2 1.5 - 4.5 g/dL   Albumin/Globulin  Ratio 2.3 (H) 1.2 - 2.2   Bilirubin Total 0.4 0.0 - 1.2 mg/dL   Alkaline Phosphatase 77 44 - 121 IU/L   AST 35 0 - 40 IU/L   ALT 39 0 - 44 IU/L  Urinalysis, Routine w reflex microscopic  Result Value Ref Range   Specific Gravity, UA 1.010 1.005 - 1.030   pH, UA 6.0 5.0 - 7.5   Color, UA Yellow Yellow   Appearance Ur Clear Clear   Leukocytes,UA Negative Negative   Protein,UA Negative Negative/Trace   Glucose, UA Negative Negative   Ketones, UA Negative Negative   RBC, UA Negative Negative   Bilirubin, UA Negative Negative   Urobilinogen, Ur 0.2 0.2 - 1.0 mg/dL   Nitrite, UA Negative Negative  [id number] 11+Oxyco+Alc+Crt-Bund  Result Value Ref Range   Ethanol Negative Cutoff=0.020 %   Amphetamines, Urine Negative Cutoff=1000 ng/mL   Barbiturate Negative Cutoff=200 ng/mL   BENZODIAZ UR QL Negative Cutoff=200 ng/mL   Cannabinoid Quant, Ur Negative Cutoff=50 ng/mL   Cocaine (Metabolite) Negative Cutoff=300 ng/mL   OPIATE SCREEN URINE Negative Cutoff=300 ng/mL   Oxycodone/Oxymorphone, Urine Negative Cutoff=300 ng/mL   Phencyclidine Negative Cutoff=25 ng/mL   Methadone Screen, Urine Negative Cutoff=300 ng/mL   Propoxyphene Negative Cutoff=300 ng/mL   Meperidine Negative Cutoff=200 ng/mL   Tramadol Negative Cutoff=200 ng/mL   Creatinine 57.8 20.0 - 300.0 mg/dL   pH, Urine 5.7 4.5 - 8.9      Assessment & Plan:   Problem List Items Addressed This Visit       Other   ADHD - Primary    Chronic.  Well controlled. Patient has not been taking the medication.  Agreed to give patient Adderall until he finishes school next years. Controlled substance agreement and UDS up to date.  Refills sent.  Follow up in 3 months for reevaluation.      Insomnia    Chronic.  Controlled with medication regimen.  Patient understands the risks of taking hypnotic medication and okay to continue.  Controlled substance agreement and UDS up to date. Refills sent today. Follow up in 3 months.      Other  Visit Diagnoses     Ringworm       Will treat with ketoconazole cream. FU if symptoms do not improve.   Relevant Medications   ketoconazole (NIZORAL) 2 % cream   Need for HPV vaccination       Relevant Orders   HPV 9-valent vaccine,Recombinat        Follow up plan: Return in about 3 months (around 01/08/2022) for Physical and Fasting labs.

## 2021-10-10 ENCOUNTER — Other Ambulatory Visit: Payer: Self-pay

## 2021-10-10 ENCOUNTER — Ambulatory Visit: Payer: PRIVATE HEALTH INSURANCE | Admitting: Nurse Practitioner

## 2021-10-10 ENCOUNTER — Encounter: Payer: Self-pay | Admitting: Nurse Practitioner

## 2021-10-10 VITALS — BP 107/64 | HR 44 | Temp 97.9°F | Ht 65.98 in | Wt 165.5 lb

## 2021-10-10 DIAGNOSIS — G4700 Insomnia, unspecified: Secondary | ICD-10-CM

## 2021-10-10 DIAGNOSIS — Z23 Encounter for immunization: Secondary | ICD-10-CM

## 2021-10-10 DIAGNOSIS — B359 Dermatophytosis, unspecified: Secondary | ICD-10-CM

## 2021-10-10 DIAGNOSIS — F909 Attention-deficit hyperactivity disorder, unspecified type: Secondary | ICD-10-CM

## 2021-10-10 MED ORDER — AMPHETAMINE-DEXTROAMPHET ER 20 MG PO CP24: 20.0000 mg | ORAL_CAPSULE | Freq: Every day | ORAL | 0 refills | Status: DC

## 2021-10-10 MED ORDER — ZOLPIDEM TARTRATE 5 MG PO TABS: 5.0000 mg | ORAL_TABLET | Freq: Every evening | ORAL | 2 refills | Status: DC | PRN

## 2021-10-10 MED ORDER — KETOCONAZOLE 2 % EX CREA: 1.0000 "application " | TOPICAL_CREAM | Freq: Every day | CUTANEOUS | 0 refills | Status: DC

## 2021-10-10 NOTE — Assessment & Plan Note (Signed)
Chronic.  Controlled with medication regimen.  Patient understands the risks of taking hypnotic medication and okay to continue.  Controlled substance agreement and UDS up to date. Refills sent today. Follow up in 3 months. 

## 2021-10-10 NOTE — Assessment & Plan Note (Signed)
Chronic.  Well controlled. Patient has not been taking the medication.  Agreed to give patient Adderall until he finishes school next years. Controlled substance agreement and UDS up to date.  Refills sent.  Follow up in 3 months for reevaluation.

## 2021-11-26 ENCOUNTER — Other Ambulatory Visit: Payer: Self-pay | Admitting: Nurse Practitioner

## 2021-11-26 NOTE — Telephone Encounter (Signed)
Requested Prescriptions  ?Pending Prescriptions Disp Refills  ?? traZODone (DESYREL) 150 MG tablet [Pharmacy Med Name: TRAZODONE 150 MG TABLET] 90 tablet 0  ?  Sig: TAKE 1 TABLET BY MOUTH EVERYDAY AT BEDTIME  ?  ? Psychiatry: Antidepressants - Serotonin Modulator Passed - 11/26/2021  2:00 AM  ?  ?  Passed - Valid encounter within last 6 months  ?  Recent Outpatient Visits   ?      ? 1 month ago Attention deficit hyperactivity disorder (ADHD), unspecified ADHD type  ? [facility] Larae Grooms, NP  ? 5 months ago Attention deficit hyperactivity disorder (ADHD), unspecified ADHD type  ? [facility] Larae Grooms, NP  ? 8 months ago Insomnia, unspecified type  ? [facility] Larae Grooms, NP  ? 10 months ago Annual physical exam  ? [facility] Larae Grooms, NP  ? 12 months ago Attention deficit hyperactivity disorder (ADHD), unspecified ADHD type  ? [facility] Larae Grooms, NP  ?  ?  ?Future Appointments   ?        ? In 1 month Larae Grooms, NP [facility]  ?  ? ?  ?  ?  ? ?

## 2022-01-09 IMAGING — US US ABDOMEN LIMITED
1 series · 14 of 25 positions shown · non-contrast
Comparison: None.

CLINICAL DATA: Chronic epigastric abdominal pain.

EXAM:
ULTRASOUND ABDOMEN LIMITED

[Series 1: us abdomen limited · 26 acquisitions, 14 frames shown]
[im 1/26]
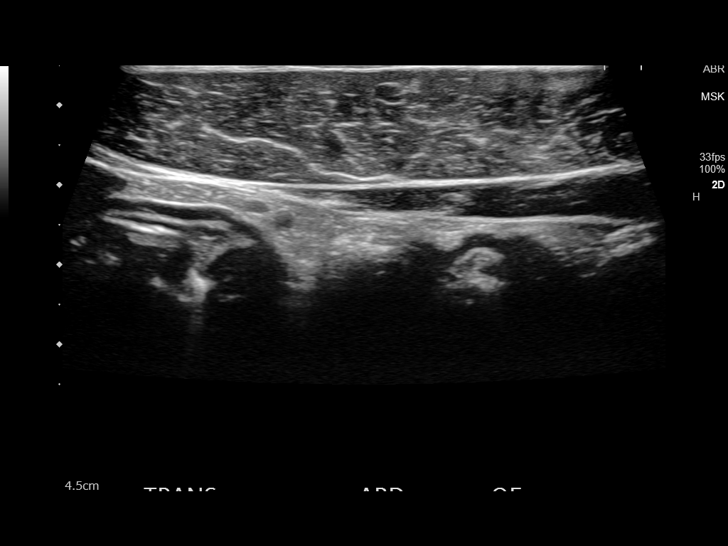
[im 3/26]
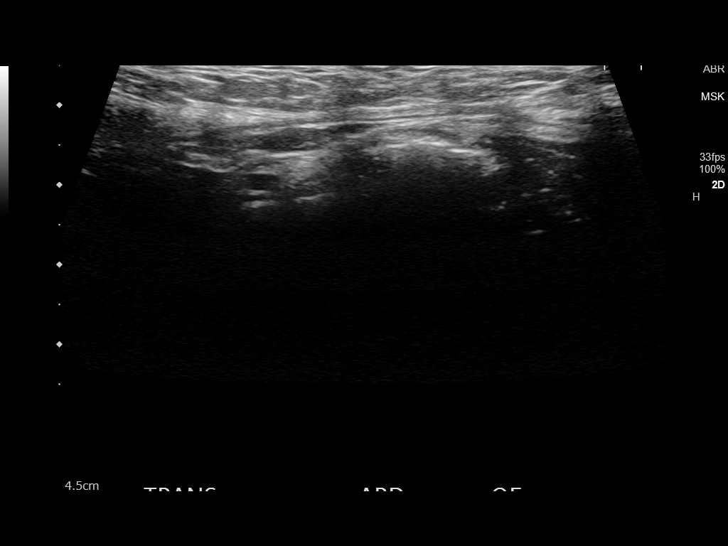
[im 5/26]
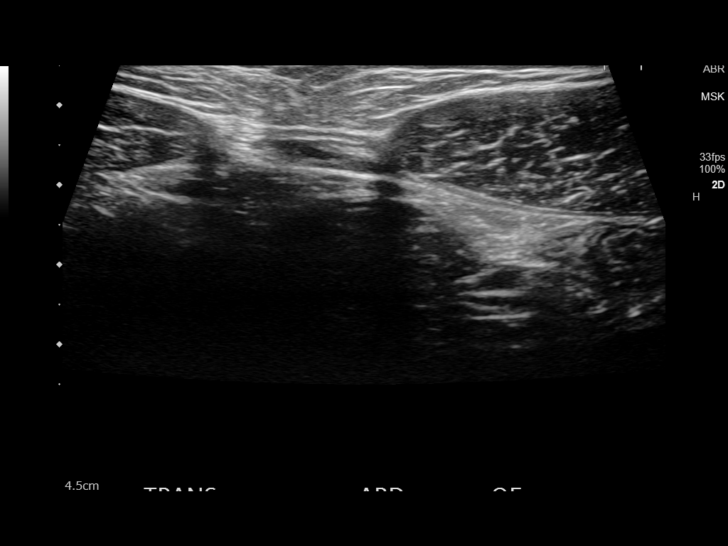
[im 7/26]
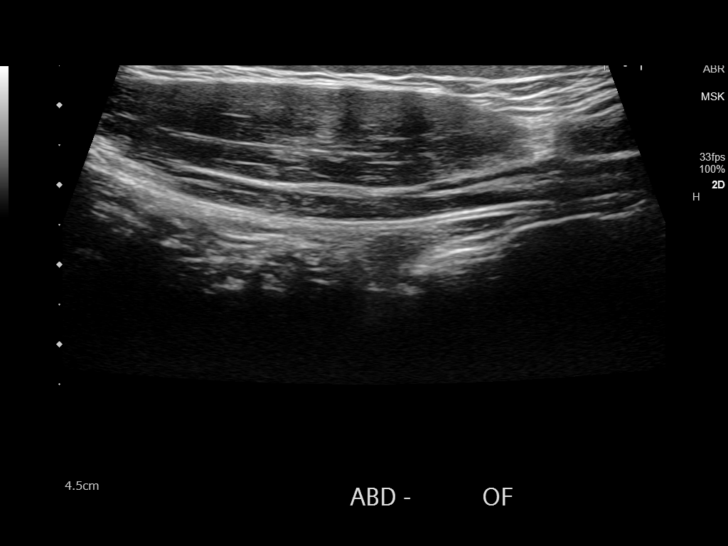
[im 9/26]
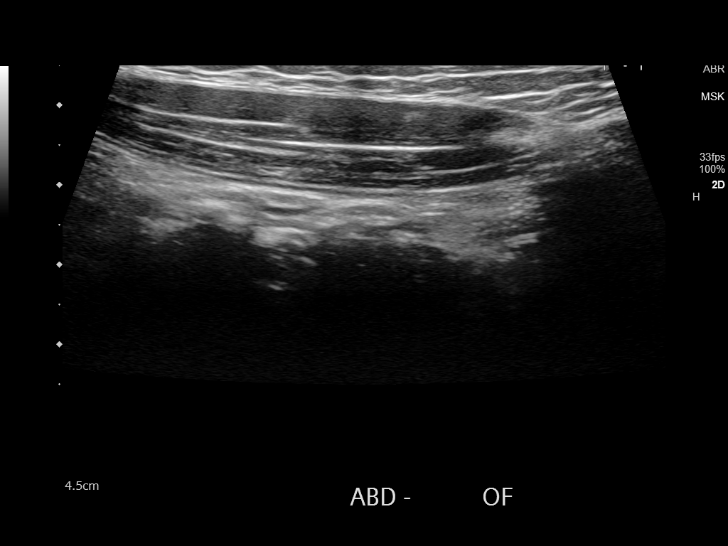
[im 10/26]
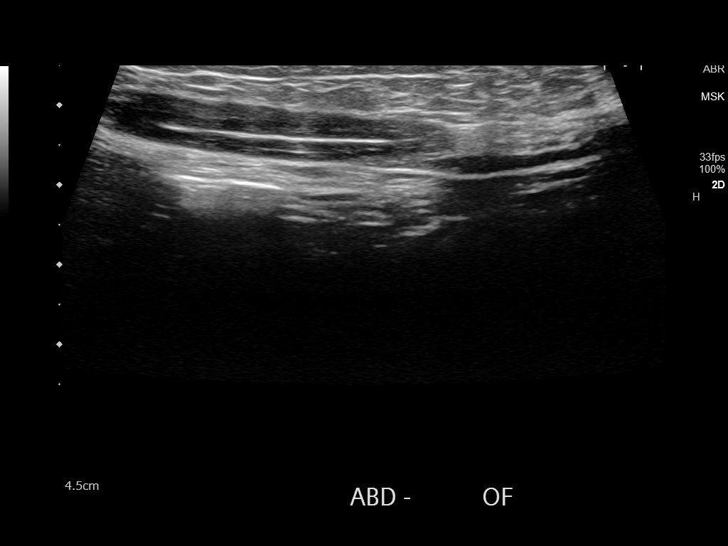
[im 12/26]
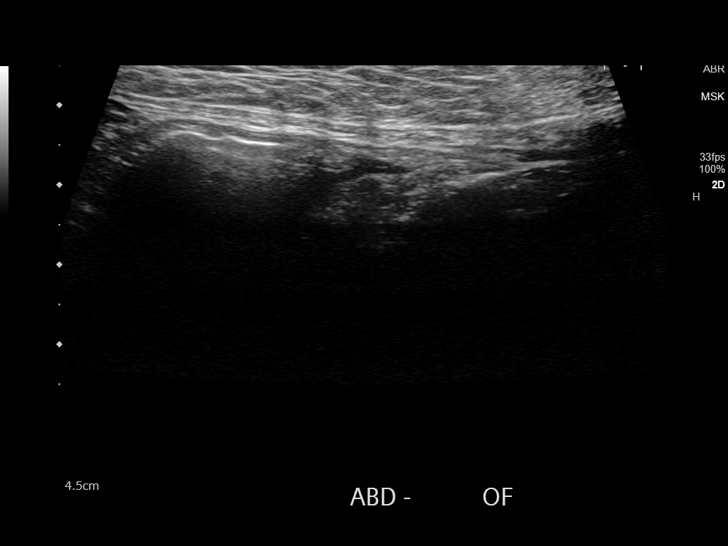
[im 14/26]
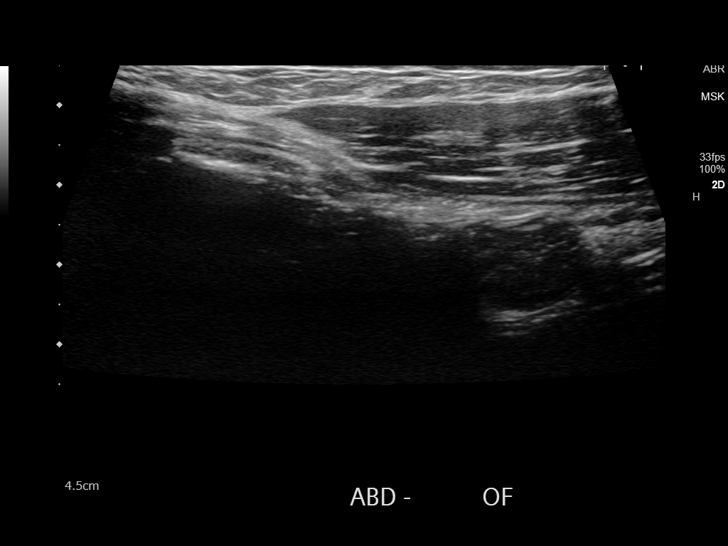
[im 16/26]
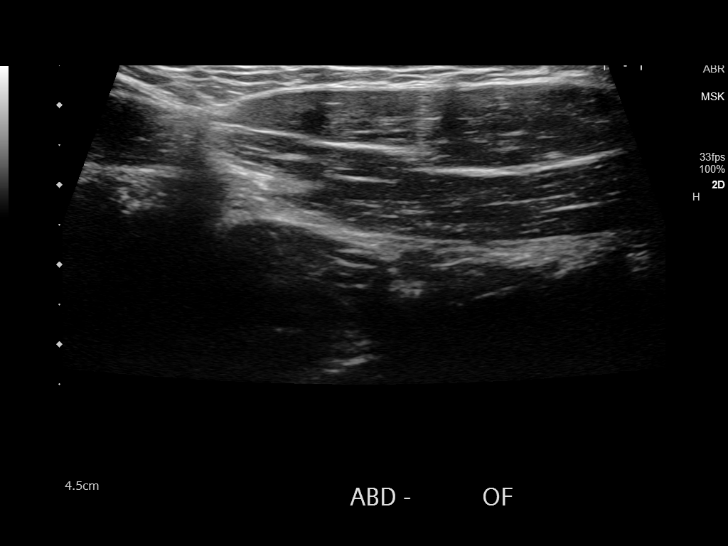
[im 17/26]
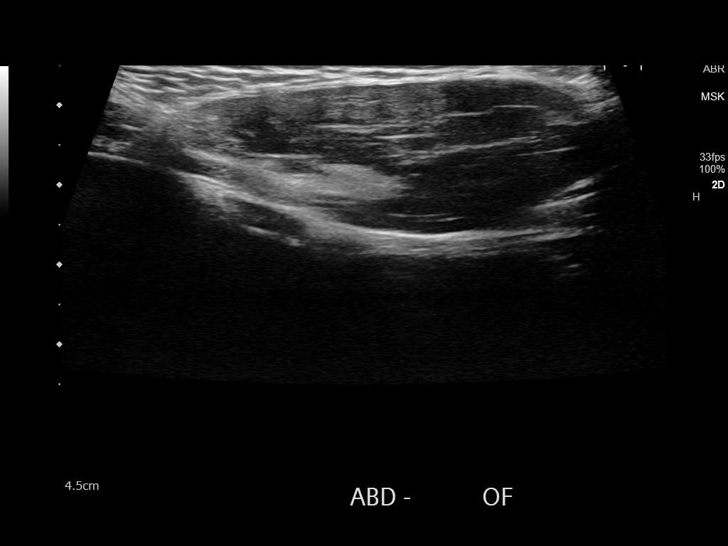
[im 19/26]
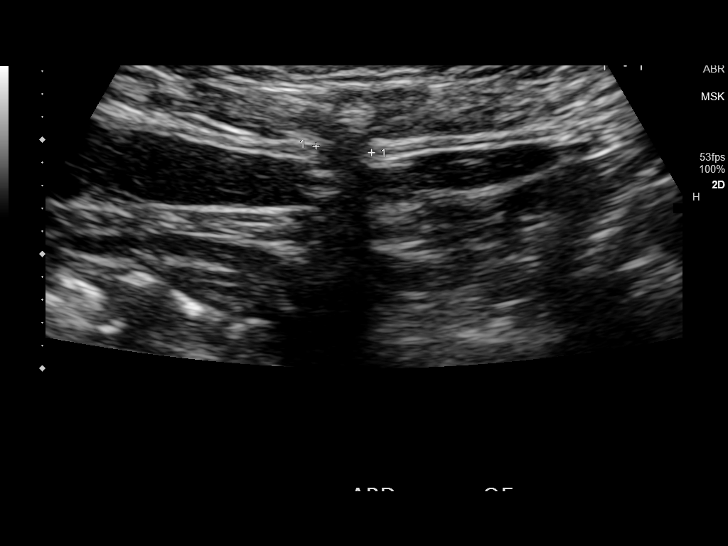
[im 21/26]
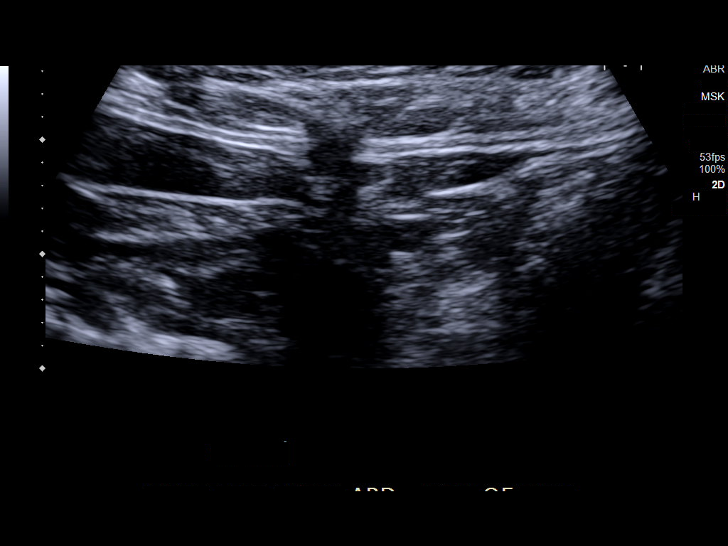
[im 23/26]
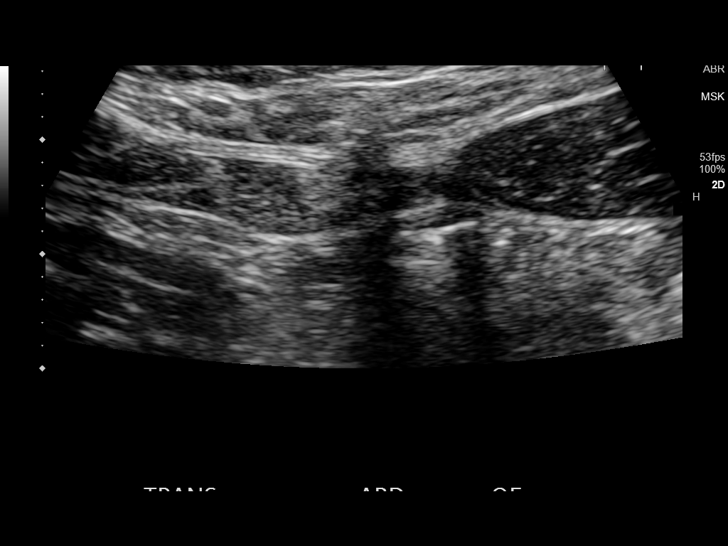
[im 26/26]
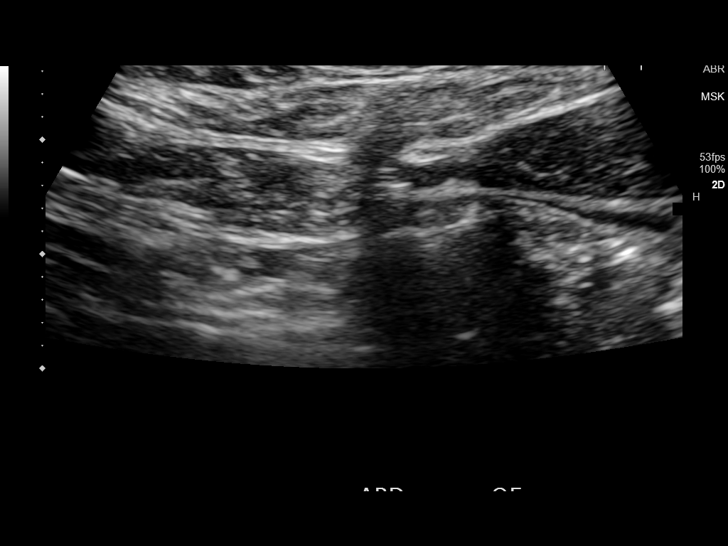

[14 of 25 positions shown; findings below may reference images not displayed]

FINDINGS: Limited sonographic evaluation was performed in the area of clinical
concern in the upper abdomen. No mass, cyst or fluid collection is
noted. Small hernia may be present which potentially may contain
bowel loops.
IMPRESSION: Probable small epigastric abdominal wall hernia is noted which may
contain bowel loops. CT scan may be performed for further
evaluation.

## 2022-01-23 ENCOUNTER — Encounter: Payer: PRIVATE HEALTH INSURANCE | Admitting: Nurse Practitioner

## 2022-01-27 ENCOUNTER — Ambulatory Visit: Payer: PRIVATE HEALTH INSURANCE | Admitting: Nurse Practitioner

## 2022-02-04 NOTE — Progress Notes (Unsigned)
 There were no vitals taken for this visit.   Subjective:    Patient ID: Dillon Sosa, male    DOB: 12/03/1995, 25 y.o.   MRN: [id number]  HPI: Dillon Sosa is a 25 y.o. male presenting on 02/05/2022 for comprehensive medical examination. Current medical complaints include:{Blank single:19197::"none","***"}  He currently lives with: Interim Problems from his last visit: {Blank single:19197::"yes","no"}  ADHD FOLLOW UP ADHD status: controlled Satisfied with current therapy: yes Medication compliance:  excellent compliance Controlled substance contract: yes Previous psychiatry evaluation: no Previous medications: no adderall   Taking meds on weekends/vacations: no Work/school performance:  good Difficulty sustaining attention/completing tasks: no Distracted by extraneous stimuli: no Does not listen when spoken to: no  Fidgets with hands or feet: yes Unable to stay in seat: no Blurts out/interrupts others: sometimes ADHD Medication Side Effects: no    Decreased appetite: no    Headache: no    Sleeping disturbance pattern: yes    Irritability: somtimes    Rebound effects (worse than baseline) off medication: no    Anxiousness:  sometimes    Dizziness: no    Tics: no   INSOMNIA Duration: years Satisfied with sleep quality: yes Difficulty falling asleep: yes Difficulty staying asleep: yes Waking a few hours after sleep onset: no Early morning awakenings: yes Daytime hypersomnolence: no Wakes feeling refreshed: yes Good sleep hygiene: yes Apnea: no Snoring: no Depressed/anxious mood:  sometimes Recent stress:  currently in school Restless legs/nocturnal leg cramps: no Chronic pain/arthritis: no History of sleep study: no Treatments attempted: ambien  Depression Screen done today and results listed below:     10/10/2021   10:32 AM 01/10/2021    8:55 AM 12/29/2019    4:27 PM 06/09/2019    1:41 PM 04/29/2018    2:53 PM  Depression screen PHQ 2/9   Decreased Interest 0 0 0 0 0  Down, Depressed, Hopeless 0 0 0 0 0  PHQ - 2 Score 0 0 0 0 0  Altered sleeping 1  1 2 3  Tired, decreased energy 0  0 0 0  Change in appetite 0  0 0 0  Feeling bad or failure about yourself  1  1 0 0  Trouble concentrating 0  1 0 1  Moving slowly or fidgety/restless 0  0 1 0  Suicidal thoughts 0  0 0 0  PHQ-9 Score 2  3 3 4  Difficult doing work/chores    Not difficult at all     The patient {has/does not have:19849} a history of falls. I {did/did not:19850} complete a risk assessment for falls. A plan of care for falls {was/was not:19852} documented.   Past Medical History:  Past Medical History:  Diagnosis Date   ADHD    Insomnia     Surgical History:  Past Surgical History:  Procedure Laterality Date   FOOT SURGERY Left 2016   WISDOM TOOTH EXTRACTION      Medications:  Current Outpatient Medications on File Prior to Visit  Medication Sig   amphetamine-dextroamphetamine (ADDERALL XR) 20 MG 24 hr capsule Take 1 capsule (20 mg total) by mouth daily.   b complex vitamins capsule Take 1 capsule by mouth daily.   diazepam (VALIUM) 10 MG tablet SMARTSIG:1 Tablet(s) By Mouth   ketoconazole (NIZORAL) 2 % cream Apply 1 application topically daily.   Omega-3 Fatty Acids (FISH OIL) 1000 MG CAPS Take by mouth.   Probiotic Product (PROBIOTIC-10 PO) Take 1 tablet by mouth daily.     traZODone (DESYREL) 150 MG tablet TAKE 1 TABLET BY MOUTH EVERYDAY AT BEDTIME   Vitamin D, Ergocalciferol, 50 MCG (2000 UT) CAPS Take 2,000 Units by mouth daily.   zolpidem (AMBIEN) 5 MG tablet Take 1 tablet (5 mg total) by mouth at bedtime as needed for sleep.   No current facility-administered medications on file prior to visit.    Allergies:  No Known Allergies  Social History:  Social History   Socioeconomic History   Marital status: Single    Spouse name: Not on file   Number of children: Not on file   Years of education: Not on file   Highest education  level: Not on file  Occupational History   Not on file  Tobacco Use   Smoking status: Former    Years: 2.00    Types: Cigarettes    Quit date: 07/2020    Years since quitting: 1.5   Smokeless tobacco: Former    Types: Chew    Quit date: 10/15/2017  Vaping Use   Vaping Use: Never used  Substance and Sexual Activity   Alcohol use: Yes    Comment: rarely   Drug use: Not Currently    Types: Cocaine   Sexual activity: Yes  Other Topics Concern   Not on file  Social History Narrative   Not on file   Social Determinants of Health   Financial Resource Strain: Not on file  Food Insecurity: Not on file  Transportation Needs: Not on file  Physical Activity: Not on file  Stress: Not on file  Social Connections: Not on file  Intimate Partner Violence: Not on file   Social History   Tobacco Use  Smoking Status Former   Years: 2.00   Types: Cigarettes   Quit date: 07/2020   Years since quitting: 1.5  Smokeless Tobacco Former   Types: Chew   Quit date: 10/15/2017   Social History   Substance and Sexual Activity  Alcohol Use Yes   Comment: rarely    Family History:  Family History  Problem Relation Age of Onset   Breast cancer Mother    Other Mother        Low heart rate   Insomnia Father    Heart attack Maternal Grandmother    Prostate cancer Neg Hx    Bladder Cancer Neg Hx    Kidney cancer Neg Hx     Past medical history, surgical history, medications, allergies, family history and social history reviewed with patient today and changes made to appropriate areas of the chart.   ROS All other ROS negative except what is listed above and in the HPI.      Objective:    There were no vitals taken for this visit.  Wt Readings from Last 3 Encounters:  10/10/21 165 lb 8 oz (75.1 kg)  07/11/21 159 lb (72.1 kg)  06/27/21 161 lb 9.6 oz (73.3 kg)    Physical Exam  Results for orders placed or performed in visit on 01/10/21  TSH  Result Value Ref Range   TSH  1.590 0.450 - 4.500 uIU/mL  Lipid panel  Result Value Ref Range   Cholesterol, Total 147 100 - 199 mg/dL   Triglycerides 82 0 - 149 mg/dL   HDL 58 >39 mg/dL   VLDL Cholesterol Cal 16 5 - 40 mg/dL   LDL Chol Calc (NIH) 73 0 - 99 mg/dL   Chol/HDL Ratio 2.5 0.0 - 5.0 ratio  CBC with Differential/Platelet  Result Value Ref  Range   WBC 4.7 3.4 - 10.8 x10E3/uL   RBC 4.93 4.14 - 5.80 x10E6/uL   Hemoglobin 15.3 13.0 - 17.7 g/dL   Hematocrit 44.7 37.5 - 51.0 %   MCV 91 79 - 97 fL   MCH 31.0 26.6 - 33.0 pg   MCHC 34.2 31.5 - 35.7 g/dL   RDW 12.9 11.6 - 15.4 %   Platelets 160 150 - 450 x10E3/uL   Neutrophils 39 Not Estab. %   Lymphs 49 Not Estab. %   Monocytes 10 Not Estab. %   Eos 1 Not Estab. %   Basos 1 Not Estab. %   Neutrophils Absolute 1.8 1.4 - 7.0 x10E3/uL   Lymphocytes Absolute 2.3 0.7 - 3.1 x10E3/uL   Monocytes Absolute 0.5 0.1 - 0.9 x10E3/uL   EOS (ABSOLUTE) 0.0 0.0 - 0.4 x10E3/uL   Basophils Absolute 0.0 0.0 - 0.2 x10E3/uL   Immature Granulocytes 0 Not Estab. %   Immature Grans (Abs) 0.0 0.0 - 0.1 x10E3/uL  Comprehensive metabolic panel  Result Value Ref Range   Glucose 84 65 - 99 mg/dL   BUN 16 6 - 20 mg/dL   Creatinine, Ser 0.88 0.76 - 1.27 mg/dL   eGFR 122 >59 mL/min/1.73   BUN/Creatinine Ratio 18 9 - 20   Sodium 144 134 - 144 mmol/L   Potassium 4.7 3.5 - 5.2 mmol/L   Chloride 101 96 - 106 mmol/L   CO2 24 20 - 29 mmol/L   Calcium 9.8 8.7 - 10.2 mg/dL   Total Protein 7.3 6.0 - 8.5 g/dL   Albumin 5.1 4.1 - 5.2 g/dL   Globulin, Total 2.2 1.5 - 4.5 g/dL   Albumin/Globulin Ratio 2.3 (H) 1.2 - 2.2   Bilirubin Total 0.4 0.0 - 1.2 mg/dL   Alkaline Phosphatase 77 44 - 121 IU/L   AST 35 0 - 40 IU/L   ALT 39 0 - 44 IU/L  Urinalysis, Routine w reflex microscopic  Result Value Ref Range   Specific Gravity, UA 1.010 1.005 - 1.030   pH, UA 6.0 5.0 - 7.5   Color, UA Yellow Yellow   Appearance Ur Clear Clear   Leukocytes,UA Negative Negative   Protein,UA Negative  Negative/Trace   Glucose, UA Negative Negative   Ketones, UA Negative Negative   RBC, UA Negative Negative   Bilirubin, UA Negative Negative   Urobilinogen, Ur 0.2 0.2 - 1.0 mg/dL   Nitrite, UA Negative Negative  [id number] 11+Oxyco+Alc+Crt-Bund  Result Value Ref Range   Ethanol Negative Cutoff=0.020 %   Amphetamines, Urine Negative Cutoff=1000 ng/mL   Barbiturate Negative Cutoff=200 ng/mL   BENZODIAZ UR QL Negative Cutoff=200 ng/mL   Cannabinoid Quant, Ur Negative Cutoff=50 ng/mL   Cocaine (Metabolite) Negative Cutoff=300 ng/mL   OPIATE SCREEN URINE Negative Cutoff=300 ng/mL   Oxycodone/Oxymorphone, Urine Negative Cutoff=300 ng/mL   Phencyclidine Negative Cutoff=25 ng/mL   Methadone Screen, Urine Negative Cutoff=300 ng/mL   Propoxyphene Negative Cutoff=300 ng/mL   Meperidine Negative Cutoff=200 ng/mL   Tramadol Negative Cutoff=200 ng/mL   Creatinine 57.8 20.0 - 300.0 mg/dL   pH, Urine 5.7 4.5 - 8.9      Assessment & Plan:   Problem List Items Addressed This Visit   None    Discussed aspirin prophylaxis for myocardial infarction prevention and decision was {Blank single:19197::"it was not indicated","made to continue ASA","made to start ASA","made to stop ASA","that we recommended ASA, and patient refused"}  LABORATORY TESTING:  Health maintenance labs ordered today as discussed above.   The natural history   of prostate cancer and ongoing controversy regarding screening and potential treatment outcomes of prostate cancer has been discussed with the patient. The meaning of a false positive PSA and a false negative PSA has been discussed. He indicates understanding of the limitations of this screening test and wishes *** to proceed with screening PSA testing.   IMMUNIZATIONS:   - Tdap: Tetanus vaccination status reviewed: {tetanus status:315746}. - Influenza: {Blank single:19197::"Up to date","Administered today","Postponed to flu season","Refused","Given elsewhere"} - Pneumovax:  {Blank single:19197::"Up to date","Administered today","Not applicable","Refused","Given elsewhere"} - Prevnar: {Blank single:19197::"Up to date","Administered today","Not applicable","Refused","Given elsewhere"} - COVID: {Blank single:19197::"Up to date","Administered today","Not applicable","Refused","Given elsewhere"} - HPV: {Blank single:19197::"Up to date","Administered today","Not applicable","Refused","Given elsewhere"} - Shingrix vaccine: {Blank single:19197::"Up to date","Administered today","Not applicable","Refused","Given elsewhere"}  SCREENING: - Colonoscopy: {Blank single:19197::"Up to date","Ordered today","Not applicable","Refused","Done elsewhere"}  Discussed with patient purpose of the colonoscopy is to detect colon cancer at curable precancerous or early stages   - AAA Screening: {Blank single:19197::"Up to date","Ordered today","Not applicable","Refused","Done elsewhere"}  -Hearing Test: {Blank single:19197::"Up to date","Ordered today","Not applicable","Refused","Done elsewhere"}  -Spirometry: {Blank single:19197::"Up to date","Ordered today","Not applicable","Refused","Done elsewhere"}   PATIENT COUNSELING:    Sexuality: Discussed sexually transmitted diseases, partner selection, use of condoms, avoidance of unintended pregnancy  and contraceptive alternatives.   Advised to avoid cigarette smoking.  I discussed with the patient that most people either abstain from alcohol or drink within safe limits (<=14/week and <=4 drinks/occasion for males, <=7/weeks and <= 3 drinks/occasion for females) and that the risk for alcohol disorders and other health effects rises proportionally with the number of drinks per week and how often a drinker exceeds daily limits.  Discussed cessation/primary prevention of drug use and availability of treatment for abuse.   Diet: Encouraged to adjust caloric intake to maintain  or achieve ideal body weight, to reduce intake of dietary saturated  fat and total fat, to limit sodium intake by avoiding high sodium foods and not adding table salt, and to maintain adequate dietary potassium and calcium preferably from fresh fruits, vegetables, and low-fat dairy products.    stressed the importance of regular exercise  Injury prevention: Discussed safety belts, safety helmets, smoke detector, smoking near bedding or upholstery.   Dental health: Discussed importance of regular tooth brushing, flossing, and dental visits.   Follow up plan: NEXT PREVENTATIVE PHYSICAL DUE IN 1 YEAR. No follow-ups on file.

## 2022-02-05 ENCOUNTER — Encounter: Payer: Self-pay | Admitting: Nurse Practitioner

## 2022-02-05 ENCOUNTER — Ambulatory Visit (INDEPENDENT_AMBULATORY_CARE_PROVIDER_SITE_OTHER): Payer: PRIVATE HEALTH INSURANCE | Admitting: Nurse Practitioner

## 2022-02-05 VITALS — BP 121/73 | HR 45 | Temp 98.3°F | Ht 66.14 in | Wt 168.0 lb

## 2022-02-05 DIAGNOSIS — G4700 Insomnia, unspecified: Secondary | ICD-10-CM

## 2022-02-05 DIAGNOSIS — Z113 Encounter for screening for infections with a predominantly sexual mode of transmission: Secondary | ICD-10-CM | POA: Diagnosis not present

## 2022-02-05 DIAGNOSIS — Z0000 Encounter for general adult medical examination without abnormal findings: Secondary | ICD-10-CM | POA: Diagnosis not present

## 2022-02-05 DIAGNOSIS — Z79899 Other long term (current) drug therapy: Secondary | ICD-10-CM

## 2022-02-05 DIAGNOSIS — F909 Attention-deficit hyperactivity disorder, unspecified type: Secondary | ICD-10-CM | POA: Diagnosis not present

## 2022-02-05 LAB — URINALYSIS, ROUTINE W REFLEX MICROSCOPIC
Bilirubin, UA: NEGATIVE
Glucose, UA: NEGATIVE
Ketones, UA: NEGATIVE
Leukocytes,UA: NEGATIVE
Nitrite, UA: NEGATIVE
Protein,UA: NEGATIVE
RBC, UA: NEGATIVE
Specific Gravity, UA: 1.02 (ref 1.005–1.030)
Urobilinogen, Ur: 0.2 mg/dL (ref 0.2–1.0)
pH, UA: 6 (ref 5.0–7.5)

## 2022-02-05 NOTE — Assessment & Plan Note (Signed)
No longer needs the Adderall since he is not in school. Will discontinue medication.

## 2022-02-05 NOTE — Assessment & Plan Note (Signed)
Chronic.  Controlled.  Continue with current medication regimen of Ambien. Will obtain UDS and controlled substance agreement at next visit if patient continues here due to insurance.  Labs ordered today.  Return to clinic in 3 months for reevaluation.  Call sooner if concerns arise.

## 2022-02-05 NOTE — Assessment & Plan Note (Signed)
Will obtain at next visit if patient is going to continue here as a patient. Will have new insurance and may not be in network.  Will refill for 3 months and plan to follow up in in 3 months if he does not have a new PCP.

## 2022-02-06 ENCOUNTER — Encounter: Payer: PRIVATE HEALTH INSURANCE | Admitting: Urology

## 2022-02-06 LAB — CBC WITH DIFFERENTIAL/PLATELET
Basophils Absolute: 0 10*3/uL (ref 0.0–0.2)
Basos: 1 %
EOS (ABSOLUTE): 0 10*3/uL (ref 0.0–0.4)
Eos: 1 %
Hematocrit: 44.2 % (ref 37.5–51.0)
Hemoglobin: 15.5 g/dL (ref 13.0–17.7)
Immature Grans (Abs): 0 10*3/uL (ref 0.0–0.1)
Immature Granulocytes: 1 %
Lymphocytes Absolute: 2.8 10*3/uL (ref 0.7–3.1)
Lymphs: 45 %
MCH: 31.3 pg (ref 26.6–33.0)
MCHC: 35.1 g/dL (ref 31.5–35.7)
MCV: 89 fL (ref 79–97)
Monocytes Absolute: 0.5 10*3/uL (ref 0.1–0.9)
Monocytes: 8 %
Neutrophils Absolute: 2.8 10*3/uL (ref 1.4–7.0)
Neutrophils: 44 %
Platelets: 184 10*3/uL (ref 150–450)
RBC: 4.96 x10E6/uL (ref 4.14–5.80)
RDW: 12.6 % (ref 11.6–15.4)
WBC: 6.1 10*3/uL (ref 3.4–10.8)

## 2022-02-06 LAB — LIPID PANEL
Chol/HDL Ratio: 2.4 ratio (ref 0.0–5.0)
Cholesterol, Total: 165 mg/dL (ref 100–199)
HDL: 70 mg/dL (ref >39)
LDL Chol Calc (NIH): 86 mg/dL (ref 0–99)
Triglycerides: 38 mg/dL (ref 0–149)
VLDL Cholesterol Cal: 9 mg/dL (ref 5–40)

## 2022-02-06 LAB — COMPREHENSIVE METABOLIC PANEL
ALT: 24 IU/L (ref 0–44)
AST: 28 IU/L (ref 0–40)
Albumin/Globulin Ratio: 2 (ref 1.2–2.2)
Albumin: 4.9 g/dL (ref 4.1–5.2)
Alkaline Phosphatase: 83 IU/L (ref 44–121)
BUN/Creatinine Ratio: 17 (ref 9–20)
BUN: 19 mg/dL (ref 6–20)
Bilirubin Total: 0.5 mg/dL (ref 0.0–1.2)
CO2: 25 mmol/L (ref 20–29)
Calcium: 9.7 mg/dL (ref 8.7–10.2)
Chloride: 101 mmol/L (ref 96–106)
Creatinine, Ser: 1.1 mg/dL (ref 0.76–1.27)
Globulin, Total: 2.5 g/dL (ref 1.5–4.5)
Glucose: 86 mg/dL (ref 70–99)
Potassium: 4.2 mmol/L (ref 3.5–5.2)
Sodium: 139 mmol/L (ref 134–144)
Total Protein: 7.4 g/dL (ref 6.0–8.5)
eGFR: 95 mL/min/{1.73_m2} (ref >59)

## 2022-02-06 LAB — HIV ANTIBODY (ROUTINE TESTING W REFLEX): HIV Screen 4th Generation wRfx: NONREACTIVE

## 2022-02-06 LAB — RPR: RPR Ser Ql: NONREACTIVE

## 2022-02-06 LAB — HSV(HERPES SIMPLEX VRS) I + II AB-IGG
HSV 1 Glycoprotein G Ab, IgG: <0.91 index (ref 0.00–0.90)
HSV 2 IgG, Type Spec: <0.91 index (ref 0.00–0.90)

## 2022-02-06 LAB — TSH: TSH: 1.02 u[IU]/mL (ref 0.450–4.500)

## 2022-02-06 LAB — HEPATITIS C ANTIBODY: Hep C Virus Ab: NONREACTIVE

## 2022-02-06 NOTE — Progress Notes (Signed)
Hi Dillon Sosa. It was nice to see you yesterday.  Your lab work looks good.  No evidence of STD.  No concerns at this time. Continue with your current medication regimen.  Follow up as discussed.  Please let me know if you have any questions.

## 2022-02-08 LAB — CHLAMYDIA/GONOCOCCUS/TRICHOMONAS, NAA
Chlamydia by NAA: NEGATIVE
Gonococcus by NAA: NEGATIVE
Trich vag by NAA: NEGATIVE

## 2022-02-13 ENCOUNTER — Ambulatory Visit
Admission: EM | Admit: 2022-02-13 | Discharge: 2022-02-13 | Disposition: A | Discharge status: Home / Self Care | Payer: No Typology Code available for payment source

## 2022-02-13 DIAGNOSIS — Z111 Encounter for screening for respiratory tuberculosis: Secondary | ICD-10-CM

## 2022-02-13 NOTE — ED Triage Notes (Signed)
Pt came in to receive his TB skin test. He is due to come back in 48-72 hours.

## 2022-02-15 ENCOUNTER — Ambulatory Visit
Admission: EM | Admit: 2022-02-15 | Discharge: 2022-02-15 | Disposition: A | Discharge status: Home / Self Care | Payer: No Typology Code available for payment source | Attending: Emergency Medicine | Admitting: Emergency Medicine

## 2022-02-15 DIAGNOSIS — R7611 Nonspecific reaction to tuberculin skin test without active tuberculosis: Secondary | ICD-10-CM | POA: Diagnosis not present

## 2022-02-15 DIAGNOSIS — Z111 Encounter for screening for respiratory tuberculosis: Secondary | ICD-10-CM

## 2022-02-15 NOTE — ED Notes (Signed)
PPD skin test result on 02/15/22 at 1028 was 53mm Negative on left forearm. Chinita Pester RN

## 2022-02-16 ENCOUNTER — Encounter: Payer: Self-pay | Admitting: Nurse Practitioner

## 2022-02-20 ENCOUNTER — Ambulatory Visit: Payer: PRIVATE HEALTH INSURANCE | Admitting: Nurse Practitioner

## 2022-02-24 ENCOUNTER — Encounter: Payer: Self-pay | Admitting: Nurse Practitioner

## 2022-02-25 ENCOUNTER — Other Ambulatory Visit: Payer: Self-pay | Admitting: Nurse Practitioner

## 2022-02-25 NOTE — Telephone Encounter (Signed)
Requested Prescriptions  Pending Prescriptions Disp Refills  . traZODone (DESYREL) 150 MG tablet [Pharmacy Med Name: TRAZODONE 150 MG TABLET] 90 tablet 0    Sig: TAKE 1 TABLET BY MOUTH EVERYDAY AT BEDTIME     Psychiatry: Antidepressants - Serotonin Modulator Passed - 02/25/2022  1:57 AM      Passed - Valid encounter within last 6 months    Recent Outpatient Visits          2 weeks ago Annual physical exam   [facility] Larae Grooms, NP   4 months ago Attention deficit hyperactivity disorder (ADHD), unspecified ADHD type   [facility] Larae Grooms, NP   8 months ago Attention deficit hyperactivity disorder (ADHD), unspecified ADHD type   [facility] Larae Grooms, NP   11 months ago Insomnia, unspecified type   [facility] Larae Grooms, NP   1 year ago Annual physical exam   [facility] Larae Grooms, NP      Future Appointments            In 2 days  [facility]   In 2 months Larae Grooms, NP [facility], [facility]

## 2022-02-27 ENCOUNTER — Ambulatory Visit (INDEPENDENT_AMBULATORY_CARE_PROVIDER_SITE_OTHER): Payer: PRIVATE HEALTH INSURANCE

## 2022-02-27 ENCOUNTER — Encounter: Payer: Self-pay | Admitting: Nurse Practitioner

## 2022-02-27 ENCOUNTER — Other Ambulatory Visit: Payer: Self-pay | Admitting: Family Medicine

## 2022-02-27 DIAGNOSIS — Z23 Encounter for immunization: Secondary | ICD-10-CM

## 2022-03-05 ENCOUNTER — Telehealth: Payer: Self-pay

## 2022-03-05 ENCOUNTER — Other Ambulatory Visit: Payer: Self-pay | Admitting: Family Medicine

## 2022-03-05 ENCOUNTER — Encounter: Payer: Self-pay | Admitting: Nurse Practitioner

## 2022-03-05 ENCOUNTER — Ambulatory Visit: Payer: PRIVATE HEALTH INSURANCE | Admitting: Nurse Practitioner

## 2022-03-05 NOTE — Telephone Encounter (Signed)
Copied from CRM [id number]. Topic: General - Other >> Mar 05, 2022  9:31 AM Dondra Prader E wrote: Reason for CRM: Pt called and wants a call back from the clinic  "I have a trip planned to [place] late next month, wants to know if there are any vaccines that I need to go"  Best contact: [phone]   Routing to provider to advise.

## 2022-03-09 ENCOUNTER — Ambulatory Visit (INDEPENDENT_AMBULATORY_CARE_PROVIDER_SITE_OTHER): Payer: PRIVATE HEALTH INSURANCE

## 2022-03-09 DIAGNOSIS — Z23 Encounter for immunization: Secondary | ICD-10-CM | POA: Diagnosis not present

## 2022-03-21 ENCOUNTER — Encounter: Payer: Self-pay | Admitting: Nurse Practitioner

## 2022-05-08 ENCOUNTER — Ambulatory Visit: Payer: PRIVATE HEALTH INSURANCE | Admitting: Nurse Practitioner

## 2022-07-27 NOTE — Progress Notes (Unsigned)
There were no vitals taken for this visit.   Subjective:    Patient ID: Dillon Sosa, male    DOB: February 21, 1996, 26 y.o.   MRN: [id number]  HPI: Dillon Sosa is a 26 y.o. male  No chief complaint on file.  ADHD FOLLOW UP ADHD status: controlled- patient hasn't been taking it for about 2 months due to not having refills.  He doesn't like taking the medication but see's a difference in his production when he does take.  Satisfied with current therapy: yes Medication compliance:  excellent compliance Controlled substance contract: yes Previous psychiatry evaluation: no Previous medications: no adderall   Taking meds on weekends/vacations: no Work/school performance:  good Difficulty sustaining attention/completing tasks: no Distracted by extraneous stimuli: no Does not listen when spoken to: no  Fidgets with hands or feet: yes Unable to stay in seat: no Blurts out/interrupts others: sometimes ADHD Medication Side Effects: no    Decreased appetite: no    Headache: no    Sleeping disturbance pattern: yes    Irritability: somtimes    Rebound effects (worse than baseline) off medication: no    Anxiousness:  sometimes    Dizziness: no    Tics: no  INSOMNIA Duration: years Satisfied with sleep quality: yes Difficulty falling asleep: yes Difficulty staying asleep: yes Waking a few hours after sleep onset: no Early morning awakenings: yes Daytime hypersomnolence: no Wakes feeling refreshed: yes Good sleep hygiene: yes Apnea: no Snoring: no Depressed/anxious mood:  sometimes Recent stress:  currently in school Restless legs/nocturnal leg cramps: no Chronic pain/arthritis: no History of sleep study: no Treatments attempted: ambien   Patient has ring worm on the back of his left arm.  Relevant past medical, surgical, family and social history reviewed and updated as indicated. Interim medical history since our last visit reviewed. Allergies and medications  reviewed and updated.  Review of Systems  Skin:  Positive for rash.  Neurological:  Negative for dizziness and headaches.  Psychiatric/Behavioral:  Positive for decreased concentration and sleep disturbance. Negative for agitation. The patient is nervous/anxious.    Per HPI unless specifically indicated above     Objective:    There were no vitals taken for this visit.  Wt Readings from Last 3 Encounters:  02/05/22 168 lb (76.2 kg)  10/10/21 165 lb 8 oz (75.1 kg)  07/11/21 159 lb (72.1 kg)    Physical Exam Vitals and nursing note reviewed.  Constitutional:      General: He is not in acute distress.    Appearance: Normal appearance. He is not ill-appearing, toxic-appearing or diaphoretic.  HENT:     Head: Normocephalic.     Right Ear: External ear normal.     Left Ear: External ear normal.     Nose: Nose normal. No congestion or rhinorrhea.     Mouth/Throat:     Mouth: Mucous membranes are moist.  Eyes:     General:        Right eye: No discharge.        Left eye: No discharge.     Extraocular Movements: Extraocular movements intact.     Conjunctiva/sclera: Conjunctivae normal.     Pupils: Pupils are equal, round, and reactive to light.  Cardiovascular:     Rate and Rhythm: Normal rate and regular rhythm.     Heart sounds: No murmur heard. Pulmonary:     Effort: Pulmonary effort is normal. No respiratory distress.     Breath sounds: Normal breath sounds.  No wheezing, rhonchi or rales.  Abdominal:     General: Abdomen is flat. Bowel sounds are normal.  Musculoskeletal:     Cervical back: Normal range of motion and neck supple.  Skin:    General: Skin is warm and dry.     Capillary Refill: Capillary refill takes less than 2 seconds.       Neurological:     General: No focal deficit present.     Mental Status: He is alert and oriented to person, place, and time.  Psychiatric:        Mood and Affect: Mood normal.        Behavior: Behavior normal.        Thought  Content: Thought content normal.        Judgment: Judgment normal.    Results for orders placed or performed in visit on 02/05/22  Chlamydia/Gonococcus/Trichomonas, NAA   Specimen: Urine   UR  Result Value Ref Range   Chlamydia by NAA Negative Negative   Gonococcus by NAA Negative Negative   Trich vag by NAA Negative Negative  TSH  Result Value Ref Range   TSH 1.020 0.450 - 4.500 uIU/mL  Lipid panel  Result Value Ref Range   Cholesterol, Total 165 100 - 199 mg/dL   Triglycerides 38 0 - 149 mg/dL   HDL 70 >39 mg/dL   VLDL Cholesterol Cal 9 5 - 40 mg/dL   LDL Chol Calc (NIH) 86 0 - 99 mg/dL   Chol/HDL Ratio 2.4 0.0 - 5.0 ratio  CBC with Differential/Platelet  Result Value Ref Range   WBC 6.1 3.4 - 10.8 x10E3/uL   RBC 4.96 4.14 - 5.80 x10E6/uL   Hemoglobin 15.5 13.0 - 17.7 g/dL   Hematocrit 44.2 37.5 - 51.0 %   MCV 89 79 - 97 fL   MCH 31.3 26.6 - 33.0 pg   MCHC 35.1 31.5 - 35.7 g/dL   RDW 12.6 11.6 - 15.4 %   Platelets 184 150 - 450 x10E3/uL   Neutrophils 44 Not Estab. %   Lymphs 45 Not Estab. %   Monocytes 8 Not Estab. %   Eos 1 Not Estab. %   Basos 1 Not Estab. %   Neutrophils Absolute 2.8 1.4 - 7.0 x10E3/uL   Lymphocytes Absolute 2.8 0.7 - 3.1 x10E3/uL   Monocytes Absolute 0.5 0.1 - 0.9 x10E3/uL   EOS (ABSOLUTE) 0.0 0.0 - 0.4 x10E3/uL   Basophils Absolute 0.0 0.0 - 0.2 x10E3/uL   Immature Granulocytes 1 Not Estab. %   Immature Grans (Abs) 0.0 0.0 - 0.1 x10E3/uL  Comprehensive metabolic panel  Result Value Ref Range   Glucose 86 70 - 99 mg/dL   BUN 19 6 - 20 mg/dL   Creatinine, Ser 1.10 0.76 - 1.27 mg/dL   eGFR 95 >59 mL/min/1.73   BUN/Creatinine Ratio 17 9 - 20   Sodium 139 134 - 144 mmol/L   Potassium 4.2 3.5 - 5.2 mmol/L   Chloride 101 96 - 106 mmol/L   CO2 25 20 - 29 mmol/L   Calcium 9.7 8.7 - 10.2 mg/dL   Total Protein 7.4 6.0 - 8.5 g/dL   Albumin 4.9 4.1 - 5.2 g/dL   Globulin, Total 2.5 1.5 - 4.5 g/dL   Albumin/Globulin Ratio 2.0 1.2 - 2.2    Bilirubin Total 0.5 0.0 - 1.2 mg/dL   Alkaline Phosphatase 83 44 - 121 IU/L   AST 28 0 - 40 IU/L   ALT 24 0 - 44 IU/L  Urinalysis, Routine w reflex microscopic  Result Value Ref Range   Specific Gravity, UA 1.020 1.005 - 1.030   pH, UA 6.0 5.0 - 7.5   Color, UA Yellow Yellow   Appearance Ur Clear Clear   Leukocytes,UA Negative Negative   Protein,UA Negative Negative/Trace   Glucose, UA Negative Negative   Ketones, UA Negative Negative   RBC, UA Negative Negative   Bilirubin, UA Negative Negative   Urobilinogen, Ur 0.2 0.2 - 1.0 mg/dL   Nitrite, UA Negative Negative  HIV Antibody (routine testing w rflx)  Result Value Ref Range   HIV Screen 4th Generation wRfx Non Reactive Non Reactive  RPR  Result Value Ref Range   RPR Ser Ql Non Reactive Non Reactive  HSV(herpes simplex vrs) 1+2 ab-IgG  Result Value Ref Range   HSV 1 Glycoprotein G Ab, IgG <0.91 0.00 - 0.90 index   HSV 2 IgG, Type Spec <0.91 0.00 - 0.90 index  Hepatitis C antibody  Result Value Ref Range   Hep C Virus Ab Non Reactive Non Reactive      Assessment & Plan:   Problem List Items Addressed This Visit   None    Follow up plan: No follow-ups on file.

## 2022-07-28 ENCOUNTER — Ambulatory Visit: Payer: 59 | Admitting: Nurse Practitioner

## 2022-07-28 ENCOUNTER — Encounter: Payer: Self-pay | Admitting: Nurse Practitioner

## 2022-07-28 VITALS — BP 114/76 | HR 42 | Temp 97.7°F | Wt 170.0 lb

## 2022-07-28 DIAGNOSIS — G4700 Insomnia, unspecified: Secondary | ICD-10-CM | POA: Diagnosis not present

## 2022-07-28 DIAGNOSIS — Z79899 Other long term (current) drug therapy: Secondary | ICD-10-CM

## 2022-07-28 MED ORDER — ZOLPIDEM TARTRATE 5 MG PO TABS: 5.0000 mg | ORAL_TABLET | Freq: Every evening | ORAL | 2 refills | Status: DC | PRN

## 2022-07-29 NOTE — Assessment & Plan Note (Signed)
Chronic.  Controlled.  Continue with current medication regimen of Ambien. UDS and controlled substance agreement updated today.  Labs ordered today.  Return to clinic in 3 months for reevaluation.  Call sooner if concerns arise.

## 2022-07-29 NOTE — Assessment & Plan Note (Signed)
Controlled substance agreement and UDS obtained at visit today.

## 2022-07-30 LAB — DRUG SCREEN 764883 11+OXYCO+ALC+CRT-BUND
Amphetamines, Urine: NEGATIVE ng/mL
BENZODIAZ UR QL: NEGATIVE ng/mL
Barbiturate: NEGATIVE ng/mL
Cannabinoid Quant, Ur: NEGATIVE ng/mL
Cocaine (Metabolite): NEGATIVE ng/mL
Creatinine: 108.2 mg/dL (ref 20.0–300.0)
Ethanol: NEGATIVE %
Meperidine: NEGATIVE ng/mL
Methadone Screen, Urine: NEGATIVE ng/mL
OPIATE SCREEN URINE: NEGATIVE ng/mL
Oxycodone/Oxymorphone, Urine: NEGATIVE ng/mL
Phencyclidine: NEGATIVE ng/mL
Propoxyphene: NEGATIVE ng/mL
Tramadol: NEGATIVE ng/mL
pH, Urine: 6.3 (ref 4.5–8.9)

## 2022-07-30 NOTE — Progress Notes (Signed)
Hi Dillon Sosa. Your drug screen looks good.  See you at our next visit.

## 2022-08-30 ENCOUNTER — Encounter: Payer: Self-pay | Admitting: Nurse Practitioner

## 2022-10-28 ENCOUNTER — Ambulatory Visit: Payer: 59 | Admitting: Nurse Practitioner

## 2022-11-10 ENCOUNTER — Ambulatory Visit: Payer: 59 | Admitting: Nurse Practitioner

## 2022-11-10 ENCOUNTER — Encounter: Payer: Self-pay | Admitting: Nurse Practitioner

## 2022-11-10 VITALS — BP 113/65 | HR 47 | Temp 97.8°F | Wt 172.6 lb

## 2022-11-10 DIAGNOSIS — G4700 Insomnia, unspecified: Secondary | ICD-10-CM

## 2022-11-10 DIAGNOSIS — Z113 Encounter for screening for infections with a predominantly sexual mode of transmission: Secondary | ICD-10-CM

## 2022-11-10 MED ORDER — ZOLPIDEM TARTRATE 5 MG PO TABS: 5.0000 mg | ORAL_TABLET | Freq: Every evening | ORAL | 2 refills | Status: AC | PRN

## 2022-11-10 NOTE — Progress Notes (Signed)
BP 113/65   Pulse (!) 47   Temp 97.8 F (36.6 C) (Oral)   Wt 172 lb 9.6 oz (78.3 kg)   SpO2 98%   BMI 27.74 kg/m    Subjective:    Patient ID: Dillon Sosa, male    DOB: 1996/03/02, 27 y.o.   MRN: [id number]  HPI: Dillon Sosa is a 27 y.o. male  Chief Complaint  Patient presents with   ADHD   Insomnia    INSOMNIA Patient states he is taking The ambien nightly to every other night.   Duration: years Satisfied with sleep quality: yes Difficulty falling asleep: yes Difficulty staying asleep: yes Waking a few hours after sleep onset: no Early morning awakenings: yes Daytime hypersomnolence: no Wakes feeling refreshed: yes Good sleep hygiene: yes Apnea: no Snoring: no Depressed/anxious mood:  sometimes Recent stress:  currently in school Restless legs/nocturnal leg cramps: no Chronic pain/arthritis: no History of sleep study: no Treatments attempted: [place]   Patient would like routine STI screening. Denies symptoms.    Relevant past medical, surgical, family and social history reviewed and updated as indicated. Interim medical history since our last visit reviewed. Allergies and medications reviewed and updated.  Review of Systems  Neurological:  Negative for dizziness and headaches.  Psychiatric/Behavioral:  Positive for sleep disturbance. Negative for agitation.     Per HPI unless specifically indicated above     Objective:    BP 113/65   Pulse (!) 47   Temp 97.8 F (36.6 C) (Oral)   Wt 172 lb 9.6 oz (78.3 kg)   SpO2 98%   BMI 27.74 kg/m   Wt Readings from Last 3 Encounters:  11/10/22 172 lb 9.6 oz (78.3 kg)  07/28/22 170 lb (77.1 kg)  02/05/22 168 lb (76.2 kg)    Physical Exam Vitals and nursing note reviewed.  Constitutional:      General: He is not in acute distress.    Appearance: Normal appearance. He is not ill-appearing, toxic-appearing or diaphoretic.  HENT:     Head: Normocephalic.     Right Ear: External ear normal.      Left Ear: External ear normal.     Nose: Nose normal. No congestion or rhinorrhea.     Mouth/Throat:     Mouth: Mucous membranes are moist.  Eyes:     General:        Right eye: No discharge.        Left eye: No discharge.     Extraocular Movements: Extraocular movements intact.     Conjunctiva/sclera: Conjunctivae normal.     Pupils: Pupils are equal, round, and reactive to light.  Cardiovascular:     Rate and Rhythm: Normal rate and regular rhythm.     Heart sounds: No murmur heard. Pulmonary:     Effort: Pulmonary effort is normal. No respiratory distress.     Breath sounds: Normal breath sounds. No wheezing, rhonchi or rales.  Abdominal:     General: Abdomen is flat. Bowel sounds are normal.  Musculoskeletal:     Cervical back: Normal range of motion and neck supple.  Skin:    General: Skin is warm and dry.     Capillary Refill: Capillary refill takes less than 2 seconds.  Neurological:     General: No focal deficit present.     Mental Status: He is alert and oriented to person, place, and time.  Psychiatric:        Mood and Affect: Mood normal.  Behavior: Behavior normal.        Thought Content: Thought content normal.        Judgment: Judgment normal.     Results for orders placed or performed in visit on 07/28/22  [id number] 11+Oxyco+Alc+Crt-Bund  Result Value Ref Range   Ethanol Negative Cutoff=0.020 %   Amphetamines, Urine Negative Cutoff=1000 ng/mL   Barbiturate Negative Cutoff=200 ng/mL   BENZODIAZ UR QL Negative Cutoff=200 ng/mL   Cannabinoid Quant, Ur Negative Cutoff=50 ng/mL   Cocaine (Metabolite) Negative Cutoff=300 ng/mL   OPIATE SCREEN URINE Negative Cutoff=300 ng/mL   Oxycodone/Oxymorphone, Urine Negative Cutoff=300 ng/mL   Phencyclidine Negative Cutoff=25 ng/mL   Methadone Screen, Urine Negative Cutoff=300 ng/mL   Propoxyphene Negative Cutoff=300 ng/mL   Meperidine Negative Cutoff=200 ng/mL   Tramadol Negative Cutoff=200 ng/mL   Creatinine  108.2 20.0 - 300.0 mg/dL   pH, Urine 6.3 4.5 - 8.9      Assessment & Plan:   Problem List Items Addressed This Visit       Other   Insomnia - Primary    Chronic.  Controlled.  Continue with current medication regimen of Ambien. UDS and controlled substance agreement up to date.  Labs ordered today.  Return to clinic in 3 months for reevaluation.  Call sooner if concerns arise.        Other Visit Diagnoses     Routine screening for STI (sexually transmitted infection)       Labs ordered today. Will await results.   Relevant Orders   HIV Antibody (routine testing w rflx)   RPR   Hepatitis C antibody   Chlamydia/Gonococcus/Trichomonas, NAA        Follow up plan: Return in about 3 months (around 02/08/2023) for Medication Management.

## 2022-11-10 NOTE — Assessment & Plan Note (Signed)
Chronic.  Controlled.  Continue with current medication regimen of Ambien. UDS and controlled substance agreement up to date.  Labs ordered today.  Return to clinic in 3 months for reevaluation.  Call sooner if concerns arise.

## 2022-11-11 LAB — HEPATITIS C ANTIBODY: Hep C Virus Ab: NONREACTIVE

## 2022-11-11 LAB — RPR: RPR Ser Ql: NONREACTIVE

## 2022-11-11 LAB — HIV ANTIBODY (ROUTINE TESTING W REFLEX): HIV Screen 4th Generation wRfx: NONREACTIVE

## 2022-11-12 LAB — CHLAMYDIA/GONOCOCCUS/TRICHOMONAS, NAA
Chlamydia by NAA: NEGATIVE
Gonococcus by NAA: NEGATIVE
Trich vag by NAA: NEGATIVE

## 2022-11-12 NOTE — Progress Notes (Signed)
HI [place].  Your STI screening was negative.

## 2023-02-25 ENCOUNTER — Encounter: Payer: Self-pay | Admitting: Nurse Practitioner
